# Patient Record
Sex: Female | Born: 1975
Health system: Southern US, Community
[De-identification: ages and names within clinical notes are randomized; demographics above are authoritative.]

## PROBLEM LIST (undated history)

## (undated) DIAGNOSIS — K259 Gastric ulcer, unspecified as acute or chronic, without hemorrhage or perforation: Secondary | ICD-10-CM

## (undated) DIAGNOSIS — J40 Bronchitis, not specified as acute or chronic: Secondary | ICD-10-CM

## (undated) DIAGNOSIS — B009 Herpesviral infection, unspecified: Secondary | ICD-10-CM

## (undated) DIAGNOSIS — K219 Gastro-esophageal reflux disease without esophagitis: Secondary | ICD-10-CM

## (undated) DIAGNOSIS — R87619 Unspecified abnormal cytological findings in specimens from cervix uteri: Secondary | ICD-10-CM

## (undated) HISTORY — PX: OTHER SURGICAL HISTORY: SHX169

## (undated) HISTORY — DX: Gastro-esophageal reflux disease without esophagitis: K21.9

## (undated) HISTORY — DX: Bronchitis, not specified as acute or chronic: J40

## (undated) HISTORY — DX: Gastric ulcer, unspecified as acute or chronic, without hemorrhage or perforation: K25.9

## (undated) HISTORY — DX: Herpesviral infection, unspecified: B00.9

## (undated) HISTORY — PX: CHOLECYSTECTOMY: SHX55

## (undated) HISTORY — PX: TUBAL LIGATION: SHX77

## (undated) HISTORY — PX: WISDOM TOOTH EXTRACTION: SHX21

## (undated) HISTORY — DX: Unspecified abnormal cytological findings in specimens from cervix uteri: R87.619

---

## 1997-09-01 ENCOUNTER — Encounter (HOSPITAL_COMMUNITY): Admission: RE | Admit: 1997-09-01 | Discharge: 1997-09-07 | Payer: Self-pay | Admitting: Obstetrics & Gynecology

## 1997-09-05 ENCOUNTER — Inpatient Hospital Stay (HOSPITAL_COMMUNITY): Admission: AD | Admit: 1997-09-05 | Discharge: 1997-09-08 | Payer: Self-pay | Admitting: Obstetrics & Gynecology

## 1997-09-09 ENCOUNTER — Encounter: Admission: RE | Admit: 1997-09-09 | Discharge: 1997-12-08 | Payer: Self-pay | Admitting: *Deleted

## 1997-09-10 ENCOUNTER — Inpatient Hospital Stay (HOSPITAL_COMMUNITY): Admission: RE | Admit: 1997-09-10 | Discharge: 1997-09-10 | Payer: Self-pay | Admitting: Obstetrics

## 1997-12-05 ENCOUNTER — Emergency Department (HOSPITAL_COMMUNITY): Admission: EM | Admit: 1997-12-05 | Discharge: 1997-12-05 | Payer: Self-pay | Admitting: Emergency Medicine

## 1998-02-16 ENCOUNTER — Inpatient Hospital Stay (HOSPITAL_COMMUNITY): Admission: AD | Admit: 1998-02-16 | Discharge: 1998-02-16 | Payer: Self-pay | Admitting: Obstetrics

## 1998-02-16 ENCOUNTER — Emergency Department (HOSPITAL_COMMUNITY): Admission: EM | Admit: 1998-02-16 | Discharge: 1998-02-16 | Payer: Self-pay | Admitting: Emergency Medicine

## 1998-06-22 ENCOUNTER — Emergency Department (HOSPITAL_COMMUNITY): Admission: EM | Admit: 1998-06-22 | Discharge: 1998-06-22 | Payer: Self-pay

## 1999-01-23 ENCOUNTER — Emergency Department (HOSPITAL_COMMUNITY): Admission: EM | Admit: 1999-01-23 | Discharge: 1999-01-23 | Payer: Self-pay | Admitting: Emergency Medicine

## 1999-04-17 ENCOUNTER — Emergency Department (HOSPITAL_COMMUNITY): Admission: EM | Admit: 1999-04-17 | Discharge: 1999-04-17 | Payer: Self-pay | Admitting: Emergency Medicine

## 1999-07-01 ENCOUNTER — Emergency Department (HOSPITAL_COMMUNITY): Admission: EM | Admit: 1999-07-01 | Discharge: 1999-07-02 | Payer: Self-pay | Admitting: Emergency Medicine

## 1999-11-01 ENCOUNTER — Emergency Department (HOSPITAL_COMMUNITY): Admission: EM | Admit: 1999-11-01 | Discharge: 1999-11-01 | Payer: Self-pay | Admitting: Emergency Medicine

## 2000-08-29 ENCOUNTER — Emergency Department (HOSPITAL_COMMUNITY): Admission: EM | Admit: 2000-08-29 | Discharge: 2000-08-29 | Payer: Self-pay

## 2000-08-31 ENCOUNTER — Emergency Department (HOSPITAL_COMMUNITY): Admission: EM | Admit: 2000-08-31 | Discharge: 2000-08-31 | Payer: Self-pay | Admitting: Emergency Medicine

## 2001-01-26 ENCOUNTER — Emergency Department (HOSPITAL_COMMUNITY): Admission: EM | Admit: 2001-01-26 | Discharge: 2001-01-26 | Payer: Self-pay | Admitting: Emergency Medicine

## 2001-02-05 ENCOUNTER — Emergency Department (HOSPITAL_COMMUNITY): Admission: EM | Admit: 2001-02-05 | Discharge: 2001-02-05 | Payer: Self-pay | Admitting: *Deleted

## 2001-06-16 ENCOUNTER — Encounter: Payer: Self-pay | Admitting: Emergency Medicine

## 2001-06-16 ENCOUNTER — Emergency Department (HOSPITAL_COMMUNITY): Admission: EM | Admit: 2001-06-16 | Discharge: 2001-06-16 | Payer: Self-pay | Admitting: Emergency Medicine

## 2002-03-09 ENCOUNTER — Emergency Department (HOSPITAL_COMMUNITY): Admission: EM | Admit: 2002-03-09 | Discharge: 2002-03-09 | Payer: Self-pay

## 2002-08-24 ENCOUNTER — Inpatient Hospital Stay (HOSPITAL_COMMUNITY): Admission: AD | Admit: 2002-08-24 | Discharge: 2002-08-24 | Payer: Self-pay | Admitting: *Deleted

## 2003-02-10 ENCOUNTER — Emergency Department (HOSPITAL_COMMUNITY): Admission: EM | Admit: 2003-02-10 | Discharge: 2003-02-10 | Payer: Self-pay | Admitting: Emergency Medicine

## 2003-03-31 ENCOUNTER — Encounter: Payer: Self-pay | Admitting: Obstetrics and Gynecology

## 2003-03-31 ENCOUNTER — Inpatient Hospital Stay (HOSPITAL_COMMUNITY): Admission: AD | Admit: 2003-03-31 | Discharge: 2003-03-31 | Payer: Self-pay | Admitting: Obstetrics and Gynecology

## 2003-04-01 ENCOUNTER — Inpatient Hospital Stay (HOSPITAL_COMMUNITY): Admission: AD | Admit: 2003-04-01 | Discharge: 2003-04-04 | Payer: Self-pay | Admitting: Obstetrics and Gynecology

## 2003-04-08 ENCOUNTER — Encounter: Payer: Self-pay | Admitting: Emergency Medicine

## 2003-04-08 ENCOUNTER — Inpatient Hospital Stay (HOSPITAL_COMMUNITY): Admission: EM | Admit: 2003-04-08 | Discharge: 2003-04-11 | Payer: Self-pay | Admitting: Emergency Medicine

## 2003-04-09 ENCOUNTER — Encounter: Payer: Self-pay | Admitting: Urology

## 2003-04-09 ENCOUNTER — Encounter: Payer: Self-pay | Admitting: Cardiology

## 2003-05-06 ENCOUNTER — Ambulatory Visit: Admission: RE | Admit: 2003-05-06 | Discharge: 2003-05-06 | Payer: Self-pay | Admitting: Obstetrics and Gynecology

## 2003-05-29 ENCOUNTER — Other Ambulatory Visit: Admission: RE | Admit: 2003-05-29 | Discharge: 2003-05-29 | Payer: Self-pay | Admitting: Obstetrics and Gynecology

## 2004-06-01 ENCOUNTER — Emergency Department (HOSPITAL_COMMUNITY): Admission: EM | Admit: 2004-06-01 | Discharge: 2004-06-01 | Payer: Self-pay | Admitting: Emergency Medicine

## 2006-03-07 ENCOUNTER — Emergency Department (HOSPITAL_COMMUNITY): Admission: EM | Admit: 2006-03-07 | Discharge: 2006-03-07 | Payer: Self-pay | Admitting: Emergency Medicine

## 2006-08-09 ENCOUNTER — Emergency Department (HOSPITAL_COMMUNITY): Admission: EM | Admit: 2006-08-09 | Discharge: 2006-08-09 | Payer: Self-pay | Admitting: Emergency Medicine

## 2008-06-20 DIAGNOSIS — R87619 Unspecified abnormal cytological findings in specimens from cervix uteri: Secondary | ICD-10-CM

## 2008-06-20 HISTORY — DX: Unspecified abnormal cytological findings in specimens from cervix uteri: R87.619

## 2008-09-24 ENCOUNTER — Emergency Department (HOSPITAL_COMMUNITY): Admission: EM | Admit: 2008-09-24 | Discharge: 2008-09-24 | Payer: Self-pay | Admitting: Emergency Medicine

## 2009-03-10 ENCOUNTER — Ambulatory Visit: Payer: Self-pay | Admitting: Cardiology

## 2009-03-10 ENCOUNTER — Emergency Department (HOSPITAL_COMMUNITY): Admission: EM | Admit: 2009-03-10 | Discharge: 2009-03-10 | Payer: Self-pay | Admitting: Emergency Medicine

## 2009-03-13 ENCOUNTER — Encounter: Payer: Self-pay | Admitting: Cardiology

## 2009-03-13 ENCOUNTER — Ambulatory Visit: Payer: Self-pay

## 2009-03-20 DIAGNOSIS — I498 Other specified cardiac arrhythmias: Secondary | ICD-10-CM | POA: Insufficient documentation

## 2009-03-20 DIAGNOSIS — J40 Bronchitis, not specified as acute or chronic: Secondary | ICD-10-CM | POA: Insufficient documentation

## 2009-03-20 DIAGNOSIS — R0602 Shortness of breath: Secondary | ICD-10-CM | POA: Insufficient documentation

## 2009-03-25 ENCOUNTER — Ambulatory Visit: Payer: Self-pay | Admitting: Cardiology

## 2009-03-25 ENCOUNTER — Encounter: Payer: Self-pay | Admitting: Cardiology

## 2009-08-30 ENCOUNTER — Emergency Department (HOSPITAL_COMMUNITY): Admission: EM | Admit: 2009-08-30 | Discharge: 2009-08-30 | Payer: Self-pay | Admitting: Emergency Medicine

## 2009-12-16 ENCOUNTER — Ambulatory Visit (HOSPITAL_COMMUNITY): Admission: RE | Admit: 2009-12-16 | Discharge: 2009-12-16 | Payer: Self-pay

## 2010-02-23 ENCOUNTER — Encounter (INDEPENDENT_AMBULATORY_CARE_PROVIDER_SITE_OTHER): Payer: Self-pay | Admitting: Surgery

## 2010-02-23 ENCOUNTER — Ambulatory Visit (HOSPITAL_COMMUNITY): Admission: RE | Admit: 2010-02-23 | Discharge: 2010-02-24 | Payer: Self-pay | Admitting: Surgery

## 2010-09-02 LAB — COMPREHENSIVE METABOLIC PANEL
ALT: 10 U/L (ref 0–35)
AST: 15 U/L (ref 0–37)
Alkaline Phosphatase: 30 U/L — ABNORMAL LOW (ref 39–117)
CO2: 29 mEq/L (ref 19–32)
Chloride: 105 mEq/L (ref 96–112)
GFR calc Af Amer: >60 mL/min (ref >60)
GFR calc non Af Amer: >60 mL/min (ref >60)
Glucose, Bld: 68 mg/dL — ABNORMAL LOW (ref 70–99)
Sodium: 141 mEq/L (ref 135–145)
Total Bilirubin: 0.5 mg/dL (ref 0.3–1.2)

## 2010-09-02 LAB — DIFFERENTIAL
Basophils Absolute: 0 10*3/uL (ref 0.0–0.1)
Basophils Relative: 0 % (ref 0–1)
Eosinophils Absolute: 0.1 10*3/uL (ref 0.0–0.7)
Eosinophils Relative: 2 % (ref 0–5)
Neutrophils Relative %: 60 % (ref 43–77)

## 2010-09-02 LAB — URINALYSIS, ROUTINE W REFLEX MICROSCOPIC
Glucose, UA: NEGATIVE mg/dL
Specific Gravity, Urine: 1.01 (ref 1.005–1.030)
Urobilinogen, UA: 0.2 mg/dL (ref 0.0–1.0)

## 2010-09-02 LAB — PROTIME-INR
INR: 1.07 (ref 0.00–1.49)
Prothrombin Time: 14.1 seconds (ref 11.6–15.2)

## 2010-09-02 LAB — CBC
HCT: 38.1 % (ref 36.0–46.0)
Hemoglobin: 13.1 g/dL (ref 12.0–15.0)
MCHC: 34.5 g/dL (ref 30.0–36.0)
RBC: 4.26 MIL/uL (ref 3.87–5.11)

## 2010-09-02 LAB — URINE MICROSCOPIC-ADD ON

## 2010-09-24 LAB — URINALYSIS, ROUTINE W REFLEX MICROSCOPIC
Bilirubin Urine: NEGATIVE
Glucose, UA: NEGATIVE mg/dL
Ketones, ur: NEGATIVE mg/dL
Nitrite: NEGATIVE
Protein, ur: NEGATIVE mg/dL
Specific Gravity, Urine: 1.01 (ref 1.005–1.030)
Urobilinogen, UA: 0.2 mg/dL (ref 0.0–1.0)
pH: 7 (ref 5.0–8.0)

## 2010-09-24 LAB — CBC
MCHC: 33.8 g/dL (ref 30.0–36.0)
MCV: 87.9 fL (ref 78.0–100.0)
RBC: 3.91 MIL/uL (ref 3.87–5.11)
RDW: 15.2 % (ref 11.5–15.5)

## 2010-09-24 LAB — URINE MICROSCOPIC-ADD ON

## 2010-09-24 LAB — BASIC METABOLIC PANEL
BUN: 10 mg/dL (ref 6–23)
CO2: 24 mEq/L (ref 19–32)
Chloride: 109 mEq/L (ref 96–112)
Creatinine, Ser: 0.55 mg/dL (ref 0.4–1.2)
GFR calc Af Amer: >60 mL/min (ref >60)
Glucose, Bld: 81 mg/dL (ref 70–99)

## 2010-09-24 LAB — TROPONIN I

## 2010-09-24 LAB — CK TOTAL AND CKMB (NOT AT ARMC)
CK, MB: 1 ng/mL (ref 0.3–4.0)
Relative Index: INVALID (ref 0.0–2.5)
Total CK: 25 U/L (ref 7–177)

## 2010-09-24 LAB — BRAIN NATRIURETIC PEPTIDE: Pro B Natriuretic peptide (BNP): 467 pg/mL — ABNORMAL HIGH (ref 0.0–100.0)

## 2010-11-05 NOTE — Discharge Summary (Signed)
NAME:  April Nielsen, April Nielsen                          ACCOUNT NO.:  [id number]   MEDICAL RECORD NO.:  [id number]                   PATIENT TYPE:  INP   LOCATION:  0381                                 FACILITY:  [facility]   PHYSICIAN:  Melissa L. Ladona Ridgel, MD               DATE OF BIRTH:  25-Feb-1976   DATE OF ADMISSION:  04/08/2003  DATE OF DISCHARGE:  04/11/2003                                 DISCHARGE SUMMARY   ADMITTING DIAGNOSIS:  Chest tightness.   DISCHARGE DIAGNOSIS:  Pneumonia.   HISTORY OF PRESENT ILLNESS:  The patient is a 35 year old white female who  presented to the emergency room with increasing chest tightness and dyspnea  on exertion.  She also complained of ringing in her ears since the night  prior to admission.  Of significance, the patient is seven days postpartum  after a cesarean section.  She reports that she cannot take deep breaths,  and if she does, she feels as if her lungs are going to explode.  She denies  any cough or sputum production.  In the emergency room, she was found to  have a heart rate of 40, for which she underwent a CT of the chest to rule  out pulmonary embolus.  The CT scan revealed no pulmonary embolus, and she  was admitted to the hospital for further work-up.  As per her H&P, her past  medical history is significant only for genital herpes, and her cesarean  section which occurred seven days prior to admission.  The only medication  she was taking at home was ibuprofen and Percocet.  The patient denies  tobacco use, and states that she had had no fever, chills, nausea, or  vomiting, but has had decreased appetite since discharge from the hospital.  She relates that her incisional pain was improving.   HOSPITAL COURSE:  The patient was admitted to the telemetry floor, placed on  telemetry, and observed.  During the first night of admission, the patient  developed a cough with sputum production, and was placed on IV antibiotics.  Initially the  order was for azithromycin and ceftriaxone.  Because the  patient was breast feeding, investigation into the appropriate medication  was undertaken.  It was deemed safe to provide the patient with  azithromycin, and a cephalosporin would also be a preferred drug of choice  in a lactating patient.  Unfortunately, the patient expressed a penicillin  allergy, which was deemed to be a rash.  After discussing the possibility to  an allergic reaction to cephalosporins and the fact that there is some cross-  sensitivity to taking cephalosporins with a penicillin allergy, the patient  agreed to a trial dose with close observation.  She was started on cefepime  and azithromycin to treat a presumed hospital-acquired pneumonia.  Over the  course of the day, the patient did improve.  By the following day, her only  complaint was of a strong headache.  This persisted throughout the day, and  was relieved somewhat by Tylenol.  She did eventually state that the head  was resolved, and so no further investigation into her interventions were  deemed necessary.  A 2-D echo was performed during the course of the  hospitalization, which revealed an ejection fraction of 55-65% with a normal  left ventricular diastolic and systolic function.  Her left ventricle was  upper limit of normal.  There was trivial aortic valve regurgitation and  mild increase in her peak pulmonary pressures.  On the day of discharge, the  patient's physical examination was as follows.   DISCHARGE PHYSICAL EXAMINATION:  VITAL SIGNS:  Temperature was 98.2, blood  pressure 128/56.  Her pulse had ranged from 49-60 over the course of the  evening, and she was asymptomatic during low heart rates.  Her respiratory  rate was 20, and her saturation was 90% on room air.  GENERAL:  No acute  distress.  HEENT:  Pupils equal, round and reactive to light.  Her mucous  membranes were moist.  There were no oral lesions.  NECK:  Supple.  There  was no  JVD.  CHEST:  Clear to auscultation with no rales, rhonchi, or  wheezes.  CARDIOVASCULAR:  Regular rate and rhythm.  Positive S1 and S2.  No  S3 or S4.  ABDOMEN:  Soft, nontender, nondistended, with positive bowel  sounds.  Her incision was clean, dry, and intact.  EXTREMITIES:  No edema,  and she had 2+ pulses in her radial, femoral, and DP areas.   ASSESSMENT AND PLAN:  This is a 35 year old postpartum female who had  developed a pneumonia shortly after discharge from the hospital after having  her cesarean section.  She was started on cefepime and azithromycin with  good results.  She did not have a cross reaction with the cefepime in the  face of having a penicillin allergy.   DISCHARGE MEDICATIONS:  1. Azithromycin 250 mg p.o. x7 days, once daily.  2. Tylenol p.r.n. for pain.   FOLLOW UP:  The patient was instructed to follow up with Dr. Su Hilt for her  usual postpartum visit.  She was also instructed to establish a primary care  physician who could follow her cardiac status as an outpatient.  She was  told that if she developed fever, chills, nausea, vomiting, or worsening  cough, that she should return to her primary care physician's office or her  GYN physician's office.  She expressed understanding at being discharged on  a single agent, azithromycin 250 mg p.o., and felt that she would be able to  determine if she was worsening in her symptoms.                                               Melissa L. Ladona Ridgel, MD    MLT/MEDQ  D:  04/15/2003  T:  04/15/2003  Job:  [id number]

## 2010-11-05 NOTE — H&P (Signed)
NAME:  April Nielsen, April Nielsen NO.:  [id number]   MEDICAL RECORD NO.:  [id number]                   PATIENT TYPE:  INP   LOCATION:  9199                                 FACILITY:  [facility]   PHYSICIAN:  Osborn Coho, M.D.                DATE OF BIRTH:  1976-05-05   DATE OF ADMISSION:  04/01/2003  DATE OF DISCHARGE:                                HISTORY & PHYSICAL   CHIEF COMPLAINT:  Desires elective repeat C-section.   HISTORY:  April Nielsen is a 35 year old gravida 2, para 1-0-0-1, with a last  menstrual period in early January 2004 that was uncertain and an estimated  date of delivery of April 07, 2003, at 39 weeks 1 day, dated by a 16 week  2 day ultrasound.  The patient desires an elective repeat C-section.  Her  prenatal care has been at [facility] since approximately 14  weeks of pregnancy.  Her intake blood pressure was 110/64 with a high of  130/70 during her prenatal care.  Her issues during this pregnancy:   1. History of HSV.  2. PENICILLIN allergy.  3. Previous C-section.  4. Uncertain LMP.  5. Marginal previa, resolved.   PRENATAL LABORATORY DATA:  A positive, antibody negative.  Hemoglobin 11.2,  hematocrit 33.4, platelets 234.  RPR nonreactive.  Rubella immune.  Hepatitis B surface antigen negative.  HIV nonreactive.  Cystic fibrosis  screen negative.  Gonorrhea negative, Chlamydia negative, both in the first  trimester.  Third trimester gonorrhea negative and third trimester Chlamydia  negative.  Quad screen was within normal limits.  Group B strep was  negative.  Glucose challenge test was normal with a value of 131.  Ultrasound on Oct 23, 2002:  Single live intrauterine pregnancy with normal  amniotic fluid, with an estimated date of delivery April 07, 2003, and she  was at 16 weeks 2 days, cervical length was 5.5 cm, with a marginal anterior  placenta.  Ultrasound on Nov 13, 2002:  Normal anatomy, cervix 4.3 cm,  anterior  placenta.   PAST OBSTETRICAL/GYNECOLOGIC HISTORY:  C-section x1 in March 1999 of a 7  pound 6 ounce female infant.  C-section was performed secondary to occult  cord.   Past GYN history:  History of irregular menses with menarche at age 35 and  cycles lasting five days, history of abnormal Pap smear years ago with  normal Pap smears since.  Positive history of HSV with infrequent outbreaks,  patient denies a history of gonorrhea or Chlamydia.  She does report a  history of Trichomonas in 1999, which was treated.  She reports a history of  being positive for HPV and has had infrequent yeast infections.   PAST MEDICAL HISTORY:  Mild postpartum depression after last delivery  requiring no medicines.  Questionable history of seizures as a baby, on no  medicines.  Heart murmur.   PAST  SURGICAL HISTORY:  C-section, wisdom teeth out five to six years ago.   MEDICATIONS:  Prenatal vitamins and Tylenol p.r.n.   ALLERGIES:  PENICILLIN (rash).   SOCIAL HISTORY:  She denies a history of cigarette use, alcohol use, or drug  abuse.   FAMILY HISTORY:  Myocardial infarction in paternal grandmother, hypertension  in father, varicose veins in aunt, diabetes in paternal grandmother,  pituitary gland not working in a brother, ovarian cancer in maternal  grandmother, leukemia in paternal grandfather, leukemia in a cousin, stroke  in paternal grandmother, epilepsy in a brother.   REVIEW OF SYSTEMS:  Noncontributory.   PHYSICAL EXAMINATION:  VITAL SIGNS:  5 feet 4-1/2 inches, weight 213 pounds.  Blood pressure 120/70.  CHEST:  Lungs clear to auscultation bilaterally.  CARDIAC:  Rate and rhythm were regular.  ABDOMEN:  Gravid with a fundal height of 38 cm.  EXTREMITIES:  Within normal limits.  MONITORING:  Fetal heart rate 150.  PELVIC:  Vaginal exam deferred.  Pelvic exam at initial prenatal visit:  External genitalia within normal limits.  Vagina within normal limits.  No  palpable masses,  nontender.  BREASTS:  Breast exam in office at initial prenatal visit without masses or  discharge.  No palpable nodes.   ASSESSMENT AND PLAN:  April Nielsen is a 35 year old gravida 2, para 1, at 39  weeks 1 day, for elective repeat C-section.  Preop labs to be done.  Consent  to be signed and witnessed.                                               Osborn Coho, M.D.    AR/MEDQ  D:  04/01/2003  T:  04/01/2003  Job:  [id number]

## 2010-11-05 NOTE — Discharge Summary (Signed)
   NAME:  April Nielsen, April Nielsen                          ACCOUNT NO.:  [id number]   MEDICAL RECORD NO.:  [id number]                   PATIENT TYPE:  INP   LOCATION:  0381                                 FACILITY:  [facility]   PHYSICIAN:  Melissa L. Ladona Ridgel, MD               DATE OF BIRTH:  01-27-1976   DATE OF ADMISSION:  04/08/2003  DATE OF DISCHARGE:  04/11/2003                                 DISCHARGE SUMMARY   ADDENDUM:  The patient's condition was stable at discharge.                                               Melissa L. Ladona Ridgel, MD    MLT/MEDQ  D:  04/15/2003  T:  04/15/2003  Job:  [id number]

## 2010-11-05 NOTE — H&P (Signed)
NAME:  April Nielsen, April Nielsen                          ACCOUNT NO.:  [id number]   MEDICAL RECORD NO.:  [id number]                   PATIENT TYPE:  EMS   LOCATION:  ED                                   FACILITY:  [facility]   PHYSICIAN:  Corinna L. Lendell Caprice, MD             DATE OF BIRTH:  March 25, 1976   DATE OF ADMISSION:  04/08/2003  DATE OF DISCHARGE:                                HISTORY & PHYSICAL   CHIEF COMPLAINT:  Chest tightness.   HISTORY OF PRESENT ILLNESS:  April Nielsen is a 35 year old white female who is  seven days postpartum after a cesarean section who presents to the emergency  room with chest tightness, dyspnea, and ringing in her ears since last  night.  She reports that she cannot take a deep breath and it does not  worsen by movement.  She denies a cough.  Heart rate in the emergency room  was found to be 40.  Dr. Riley Kill of cardiology was called by Dr. Estell Harpin, the  ER physician.  He recommended ruling out pulmonary embolus and admitting to  internal medicine.  CT of the chest was reportedly negative per ER  physician.  The patient has no history of pre-risk, bradycardia, or heart  problems.   PAST MEDICAL HISTORY:  Recurrent genital herpes.  Her cesarean section seven  days ago was unremarkable.   MEDICATIONS:  1. Ibuprofen.  2. Percocet.   SOCIAL HISTORY:  The patient does not drink or smoke.   FAMILY HISTORY:  Significant for hypertension in the father.   REVIEW OF SYSTEMS:  CONSTITUTIONAL:  She has not been eating well recently,  but no vomiting.  HEENT:  No headache, no dysphagia.  RESPIRATORY:  No  cough, otherwise as above.  CARDIOVASCULAR:  No palpitations.  GASTROINTESTINAL:  No nausea, vomiting, or diarrhea.  GENITOURINARY:  No  dysuria or hematuria.  She is still having some vaginal bleeding.  MUSCULOSKELETAL:  Her incisional pain is improving.  PSYCHIATRIC:  She has  suffered from postpartum depression in the past, and is somewhat emotionally  labile.   NEUROLOGIC:  No seizures.  ENDOCRINE:  No diabetes.   PHYSICAL EXAMINATION:  VITAL SIGNS:  Her heart rate ranges in the 40s to  60s, blood pressure is 140/85, respiratory rate 22, temperature 97.2.  GENERAL:  The patient is well-developed, well-nourished, in no acute  distress.  HEENT:  Normocephalic, atraumatic.  Pupils equal, round, reactive to light.  Sclerae nonicteric.  NECK:  Supple, no thyromegaly.  LUNGS:  Clear to auscultation bilaterally without wheezes, rhonchi, or  rales.  CARDIOVASCULAR:  Slow rate, regular rhythm, without murmurs, rubs, or  gallops.  ABDOMEN:  Soft.  She has some mild incisional tenderness.  The incision  looks clean and no drainage.  GENITOURINARY:  Deferred.  RECTAL:  Deferred.  EXTREMITIES:  She has bilateral 1+ pitting edema, pulses are intact.  SKIN:  No rash.  PSYCHIATRIC:  Normal affect.  NEUROLOGIC:  Alert and oriented.  Cranial nerves and sensory motor exam are  intact.   LABORATORY DATA:  Her CBC is normal.  Her hemoglobin is 10.9, hematocrit is  31.5, white blood cell count is 8.6, platelet count 296.  Complete metabolic  panel is normal except for an albumin of 2.9.  CPK, MB, and troponin are all  normal.  EKG shows sinus bradycardia with PAC.  Chest x-ray shows no  infiltrate.  Wet reading of the CAT scan called to Dr. Estell Harpin reportedly  shows no pulmonary embolus.   ASSESSMENT AND PLAN:  1. Bradycardia of questionable cause.  The patient will be admitted to     telemetry.  I will hold her Percocet.  I will check a TSH and an     echocardiogram.  2. Tinnitus, possibly related to ibuprofen.  I will hold this and give     Tylenol instead.  3. Seven days postpartum, status post cesarean section.                                               Corinna L. Lendell Caprice, MD    CLS/MEDQ  D:  04/08/2003  T:  04/08/2003  Job:  [id number]

## 2010-11-05 NOTE — Discharge Summary (Signed)
   NAME:  April Nielsen, April Nielsen                          ACCOUNT NO.:  [id number]   MEDICAL RECORD NO.:  [id number]                   PATIENT TYPE:  INP   LOCATION:  9112                                 FACILITY:  [facility]   PHYSICIAN:  Crist Fat. Rivard, M.D.              DATE OF BIRTH:  15-Jan-1976   DATE OF ADMISSION:  04/01/2003  DATE OF DISCHARGE:                                 DISCHARGE SUMMARY   ADMISSION DIAGNOSES:  1. Intrauterine pregnancy at term.  2. Previous cesarean section.  3. Desiring repeat cesarean section.   DISCHARGE DIAGNOSES:  1. Intrauterine pregnancy at term.  2. Previous cesarean section.  3. Desiring repeat cesarean section.  4. Status post repeat low transverse cesarean section.  5. Breast and bottle feeding.   PROCEDURES THIS ADMISSION:  Repeat low transverse cesarean section for  delivery of a viable female infant who had Apgars of 8 and 9 and weighed 7  pounds 15 ounces on April 01, 2003; attended in delivery by Dr. Osborn Coho and Saverio Danker, C.N.M.   HOSPITAL COURSE:  April Nielsen is a 35 year old white female gravida 2 para 1-  0-0-1 who presents for elective repeat cesarean section at term and  underwent the same for delivery of a viable female infant who had Apgars of  8 and 9 and weighed 7 pounds 15 ounces on April 01, 2003 attended in  delivery by Dr. Blossom Hoops and [facility], C.N.M.  Please see  operative note for details.  Postoperatively the patient has done well.  She  is ambulating, voiding, and eating without difficulty.  Her vital signs are  stable and she is afebrile.  She is breast and bottle feeding and doing  well.  Her infant currently is under bilirubin therapy for jaundice but is  also doing well.  The patient is deemed ready for discharge today.   DISCHARGE INSTRUCTIONS:  As per the [facility] handout.   DISCHARGE MEDICATIONS:  1. Motrin 600 mg p.o. q.6h. p.r.n. for pain.  2. Tylox one to two p.o.  q.4-6h. p.r.n. for pain.  3. Micronor one p.o. daily.  4. Prenatal vitamins one p.o. daily.   DISCHARGE LABORATORY DATA:  Her hemoglobin is 10.0, her wbc count is 10.9,  her platelets are 188.   FOLLOW-UP:  Her discharge follow-up will be in four to six weeks at [facility] or p.r.n.    Concha Pyo. Duplantis, C.N.M.              Crist Fat Rivard, M.D.   SJD/MEDQ  D:  04/04/2003  T:  04/04/2003  Job:  [id number]

## 2010-11-05 NOTE — Op Note (Signed)
NAME:  April Nielsen, April Nielsen                          ACCOUNT NO.:  [id number]   MEDICAL RECORD NO.:  [id number]                   PATIENT TYPE:  INP   LOCATION:  9112                                 FACILITY:  [facility]   PHYSICIAN:  Osborn Coho, M.D.                DATE OF BIRTH:  1976-03-03   DATE OF PROCEDURE:  04/01/2003  DATE OF DISCHARGE:                                 OPERATIVE REPORT   PREOPERATIVE DIAGNOSES:  1. Term intrauterine pregnancy.  2. Elective repeat cesarean section.   POSTOPERATIVE DIAGNOSES:  1. Term intrauterine pregnancy.  2. Elective repeat cesarean section.   PROCEDURE:  Repeat low transverse cesarean section via Pfannenstiel skin  incision.   ANESTHESIA:  Spinal.   ATTENDING:  Osborn Coho, M.D.   ASSISTANT:  Concha Pyo. Duplantis, C.N.M.   FLUIDS REPLACED:  3000 mL.   ESTIMATED BLOOD LOSS:  1000 mL.   URINE OUTPUT:  325 mL.   COMPLICATIONS:  None.   FINDINGS:  Live female infant with Apgars of 8 at one minute and 9 at five  minutes.  Weight 7 pounds 15 ounces.   DESCRIPTION OF PROCEDURE:  The patient was taken to the operating room after  the risks, benefits, and alternatives were discussed with the patient.  The  patient verbalized understanding and consent signed and witnessed.  The  patient was given a spinal anesthesia and prepped and draped in a normal  sterile fashion.  A Pfannenstiel skin incision was made at the site of the  prior scar.  The incision was carried down to the underlying layer of fascia  with the Bovie, which was then used to excise the fascia in the midline  bilaterally.  The fascial incision was extended bilaterally with the Mayo  scissors.  Kocher clamps were placed on the superior aspect of the fascial  incision and the rectus muscle excised from the fascia.  The same was done  on the inferior aspect of the fascial incision.  The muscle was separated in  the midline with a hemostat and the peritoneum entered without  difficulty.  The bladder blade was placed and the bladder flap created with the  Metzenbaum scissors.  The uterine incision was made with the scalpel and  extended bilaterally with the bandage scissors.  The infant was delivered  without difficulty.  Upon delivery of the head the oropharynx and the  nasopharynx were bulb-suctioned.  The fluid was noted to be meconium-  stained.  The cord was clamped and cut and the infant was handed to the  waiting pediatricians.  Cord bloods were sent.  The placenta was removed via  fundal massage.  The uterus was cleared of all clots and debris.  The cervix  was minimally dilated with a ring forceps.  The uterine incision was  repaired with 0 Vicryl in a running locked fashion.  A second imbricating  layer was performed.  Bilateral ovaries were visualized and noted to be  within normal limits.  The abdomen was copiously irrigated.  The uterine  incision was noted to be hemostatic.  The peritoneum was closed with 3-0  chromic in a running fashion.  The fascia was repaired with 0 Vicryl in a  running fashion.  Plain 0 was used to reapproximate the subcutaneous tissue.  The skin was closed with staples.  Sponge, lap, and needle count was  correct.  The patient tolerated the procedure well and was returned to the  recovery room in stable condition.                                               Osborn Coho, M.D.    AR/MEDQ  D:  04/01/2003  T:  04/01/2003  Job:  [id number]

## 2011-02-02 ENCOUNTER — Encounter: Payer: Self-pay | Admitting: Cardiology

## 2012-03-02 ENCOUNTER — Encounter: Payer: Self-pay | Admitting: Sports Medicine

## 2012-03-02 ENCOUNTER — Ambulatory Visit (INDEPENDENT_AMBULATORY_CARE_PROVIDER_SITE_OTHER): Payer: 59 | Admitting: Sports Medicine

## 2012-03-02 VITALS — BP 103/70 | HR 74 | Temp 97.9°F | Ht 64.25 in | Wt 156.0 lb

## 2012-03-02 DIAGNOSIS — M542 Cervicalgia: Secondary | ICD-10-CM

## 2012-03-02 MED ORDER — TRAMADOL HCL 50 MG PO TABS: 50.0000 mg | ORAL_TABLET | Freq: Every day | ORAL | Status: DC

## 2012-03-02 NOTE — Assessment & Plan Note (Signed)
Her symptoms are well-controlled with Motrin and tramadol. I will refill these. I would like any MRI of her cervical spine. She will also do rehabilitation. I will see her back to go over the MRI results.

## 2012-03-02 NOTE — Progress Notes (Signed)
Patient ID: April Nielsen, female   DOB: [birth year], 36 y.o.   MRN: [id number] Subjective:    CC: Establish care. 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 She comes in with a long history of pain, burning, and numbness that comes down her right arm in what sounds to be a C7 type distribution. Occasionally it does come down her left side as well. She recalls going on a course of prednisone, and having the symptoms resolve for approximately a week. She did have some EMG, and nerve conductions done, and was then diagnosed with thoracic outlet syndrome. She has really never had any workup for cervical radiculopathy.  Her pain is well controlled when it occurs predominantly at bedtime. She takes a tramadol, and some Motrin at bedtime.  Past medical history, Surgical history, Family history, Social history, Allergies, and medications have been entered into the medical record, reviewed, and no changes needed.   Review of Systems: No headache, visual changes, nausea, vomiting, diarrhea, constipation, dizziness, abdominal pain, skin rash, fevers, chills, night sweats, weight loss, chest pain, body aches, joint swelling, muscle aches, or shortness of breath.   Objective:    General: Well Developed, well nourished, and in no acute distress.  Neuro: Alert and oriented x3, extra-ocular muscles intact.  HEENT: Normocephalic, atraumatic, pupils equal round reactive to light, neck supple, no masses, no lymphadenopathy, thyroid nonpalpable.  Skin: Warm and dry, no rashes noted.  Cardiac: Regular rate and rhythm, no murmurs rubs or gallops.  Respiratory: Clear to auscultation bilaterally. Not using accessory muscles, speaking in full sentences.  Abdominal: Soft, nontender, nondistended, positive bowel sounds, no masses, no organomegaly.  Musculoskeletal: Shoulder, elbow, wrist, hip, knee, ankle stable, and with full range of motion. Neck: Inspection unremarkable. No  palpable stepoffs. Negative Spurling's maneuver. Full neck range of motion, there is pain with extension it is localized in the right posterior neck. Grip strength and sensation normal in bilateral hands Strength good C4 to T1 distribution No sensory change to C4 to T1 Negative Hoffman sign bilaterally Reflexes mildly decreased to right triceps, otherwise 2+.  Impression and Recommendations:

## 2012-03-06 ENCOUNTER — Other Ambulatory Visit (HOSPITAL_BASED_OUTPATIENT_CLINIC_OR_DEPARTMENT_OTHER): Payer: 59

## 2012-03-13 ENCOUNTER — Ambulatory Visit (HOSPITAL_BASED_OUTPATIENT_CLINIC_OR_DEPARTMENT_OTHER)
Admission: RE | Admit: 2012-03-13 | Discharge: 2012-03-13 | Disposition: A | Discharge status: Home / Self Care | Payer: 59 | Source: Ambulatory Visit | Attending: Sports Medicine | Admitting: Sports Medicine

## 2012-03-13 ENCOUNTER — Telehealth: Payer: Self-pay | Admitting: Sports Medicine

## 2012-03-13 DIAGNOSIS — M5020 Other cervical disc displacement, unspecified cervical region: Secondary | ICD-10-CM | POA: Insufficient documentation

## 2012-03-13 DIAGNOSIS — M4802 Spinal stenosis, cervical region: Secondary | ICD-10-CM | POA: Insufficient documentation

## 2012-03-13 DIAGNOSIS — M542 Cervicalgia: Secondary | ICD-10-CM

## 2012-03-13 NOTE — Telephone Encounter (Signed)
Please shoot April Nielsen  acall and let her know she doesn't seem to have thoracic outlet syndrome, she has a disc protrusion worst at the C5/6 level on the right side, likely touching the nerve root and causing her symptoms.  She can see me to go over images and come up with tx plan if she likes.

## 2012-03-14 NOTE — Telephone Encounter (Signed)
Pt has been scheduled to come in Oct 1st at 10am

## 2012-03-20 ENCOUNTER — Ambulatory Visit (INDEPENDENT_AMBULATORY_CARE_PROVIDER_SITE_OTHER): Payer: 59 | Admitting: Sports Medicine

## 2012-03-20 ENCOUNTER — Encounter: Payer: Self-pay | Admitting: Sports Medicine

## 2012-03-20 VITALS — BP 131/91 | HR 97 | Ht 64.25 in | Wt 158.0 lb

## 2012-03-20 DIAGNOSIS — M542 Cervicalgia: Secondary | ICD-10-CM

## 2012-03-20 NOTE — Assessment & Plan Note (Addendum)
We had a long discussion regarding the anatomy, as well as further treatment options. We went over MRI images. Her symptoms are well controlled with tramadol, so we will stay the course for now. Should her symptoms recur, or worsen, she would be a candidate for a transforaminal or even interlaminar epidural steroid injection at the C5-C6 level. She will come back to see me on an as-needed basis, look forward to seeing this extremely pleasant young lady again.

## 2012-03-20 NOTE — Progress Notes (Signed)
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 She comes back to see me for followup of pain that she's been having in her right shoulder, and neck. At her initial visit I suspected a right-sided C7 radiculopathy. Her symptoms at that point were well controlled with tramadol at night. She carried a diagnosis of thoracic outlet syndrome from her previous physician. We obtained an MRI that confirmed our suspicion of a cervical disc protrusion. In her case, it was at the C5-C6 level, affecting the exiting right C6 nerve root.  Past medical history, Surgical history, Family history, Social history, Allergies, and medications have been entered into the medical record, reviewed, and no changes needed.   Review of Systems: No fevers, chills, night sweats, weight loss, chest pain, or shortness of breath.   Objective:    General: Well Developed, well nourished, and in no acute distress.  Neuro: Alert and oriented x3, extra-ocular muscles intact.  HEENT: Normocephalic, atraumatic, pupils equal round reactive to light. Skin: Warm and dry, no rashes. Respiratory: Not using accessory muscles, speaking in full sentences.  We went over the images, and I reviewed them myself. They do show a disc protrusion at the C4-C5, as well as the C5-C6 level, worse at the C5-C6 level causing right-sided foraminal stenosis. This appears to compress the exiting right C6 nerve root.  Impression and Recommendations:

## 2012-06-05 ENCOUNTER — Telehealth (INDEPENDENT_AMBULATORY_CARE_PROVIDER_SITE_OTHER): Payer: Self-pay

## 2012-06-05 NOTE — Telephone Encounter (Addendum)
Patient calling into office to report having an RUQ pain that radiates to her back for one week.  Patient reports some nausea and vomiting, patient reports having abnormal BM's.  Patient denies having any fever.  Patient currently on a bland diet. Patient had gallbladder removed in 2011.  Patient advised to call PCP for further follow up.

## 2012-10-16 ENCOUNTER — Encounter: Payer: Self-pay | Admitting: Sports Medicine

## 2012-10-16 ENCOUNTER — Ambulatory Visit (INDEPENDENT_AMBULATORY_CARE_PROVIDER_SITE_OTHER): Payer: 59 | Admitting: Sports Medicine

## 2012-10-16 VITALS — BP 131/82 | HR 62 | Wt 158.0 lb

## 2012-10-16 DIAGNOSIS — R1013 Epigastric pain: Secondary | ICD-10-CM | POA: Insufficient documentation

## 2012-10-16 LAB — CBC
HCT: 37.4 % (ref 36.0–46.0)
Hemoglobin: 12.9 g/dL (ref 12.0–15.0)
MCH: 29.2 pg (ref 26.0–34.0)
MCHC: 34.5 g/dL (ref 30.0–36.0)
MCV: 84.6 fL (ref 78.0–100.0)
Platelets: 255 10*3/uL (ref 150–400)
RBC: 4.42 MIL/uL (ref 3.87–5.11)
RDW: 13.9 % (ref 11.5–15.5)
WBC: 10 10*3/uL (ref 4.0–10.5)

## 2012-10-16 MED ORDER — SUCRALFATE 1 G PO TABS: 1.0000 g | ORAL_TABLET | Freq: Four times a day (QID) | ORAL | Status: DC

## 2012-10-16 MED ORDER — PANTOPRAZOLE SODIUM 40 MG PO TBEC: 40.0000 mg | DELAYED_RELEASE_TABLET | Freq: Two times a day (BID) | ORAL | Status: DC

## 2012-10-16 NOTE — Assessment & Plan Note (Signed)
Symptoms have been on and off for approximately 4 months. She is unable to find an association with food, deep breathing, palpation. She is a family history of peptic ulcer disease. As the pain does go to the back as well, I am concerned about gastric or duodenal ulcer. No red flags, such as melena, hematochezia, hematemesis, constipation. CBC, CMET, lipase, H. pylori. Adding protonix and Carafate. She'll come back to see me in 4 weeks if no better I do think we need to pursue upper endoscopy

## 2012-10-16 NOTE — Progress Notes (Signed)
  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 She is status post cholecystectomy about 4 years ago. Since then she's had pain on and off that she localizes in the above location, the pain does go to her back at times. She denies any reflux symptoms, denies any melena, hematochezia, hematemesis. She is unaware as to whether the pain mimics her periods, or is worse or better with food, deep breathing, or activity. The pain is mild to moderate. She has a family history of peptic ulcer disease.    Past medical history, Surgical history, Family history not pertinant except as noted below, Social history, Allergies, and medications have been entered into the medical record, reviewed, and no changes needed.   Review of Systems: No fevers, chills, night sweats, weight loss, chest pain, or shortness of breath.   Objective:    General: Well Developed, well nourished, and in no acute distress.  Neuro: Alert and oriented x3, extra-ocular muscles intact, sensation grossly intact.  HEENT: Normocephalic, atraumatic, pupils equal round reactive to light, neck supple, no masses, no lymphadenopathy, thyroid nonpalpable.  Skin: Warm and dry, no rashes. Cardiac: Regular rate and rhythm, no murmurs rubs or gallops, no lower extremity edema.  Respiratory: Clear to auscultation bilaterally. Not using accessory muscles, speaking in full sentences. Abdomen: Soft, nontender, nondistended, normal bowel sounds, no palpable masses, no rigidity.   Impression and Recommendations:

## 2012-10-17 LAB — URINALYSIS
Bilirubin Urine: NEGATIVE
Glucose, UA: NEGATIVE mg/dL
Hgb urine dipstick: NEGATIVE
Ketones, ur: NEGATIVE mg/dL
Leukocytes, UA: NEGATIVE
Nitrite: NEGATIVE
Protein, ur: NEGATIVE mg/dL
Specific Gravity, Urine: 1.018 (ref 1.005–1.030)
Urobilinogen, UA: 0.2 mg/dL (ref 0.0–1.0)
pH: 5.5 (ref 5.0–8.0)

## 2012-10-17 LAB — COMPREHENSIVE METABOLIC PANEL
ALT: 12 U/L (ref 0–35)
AST: 12 U/L (ref 0–37)
Alkaline Phosphatase: 26 U/L — ABNORMAL LOW (ref 39–117)
Sodium: 141 mEq/L (ref 135–145)
Total Bilirubin: 0.4 mg/dL (ref 0.3–1.2)
Total Protein: 7.1 g/dL (ref 6.0–8.3)

## 2012-10-17 LAB — COMPREHENSIVE METABOLIC PANEL WITH GFR
Albumin: 4.7 g/dL (ref 3.5–5.2)
BUN: 5 mg/dL — ABNORMAL LOW (ref 6–23)
CO2: 28 meq/L (ref 19–32)
Calcium: 9.5 mg/dL (ref 8.4–10.5)
Chloride: 104 meq/L (ref 96–112)
Creat: 0.51 mg/dL (ref 0.50–1.10)
Glucose, Bld: 83 mg/dL (ref 70–99)
Potassium: 3.5 meq/L (ref 3.5–5.3)

## 2012-10-17 LAB — H. PYLORI ANTIBODY, IGG: H Pylori IgG: 0.42 {ISR}

## 2012-10-17 LAB — LIPASE: Lipase: 17 U/L (ref 0–75)

## 2012-11-13 ENCOUNTER — Encounter: Payer: Self-pay | Admitting: Sports Medicine

## 2012-11-13 ENCOUNTER — Ambulatory Visit (INDEPENDENT_AMBULATORY_CARE_PROVIDER_SITE_OTHER): Payer: 59 | Admitting: Sports Medicine

## 2012-11-13 VITALS — BP 114/78 | HR 80 | Wt 156.0 lb

## 2012-11-13 DIAGNOSIS — R1013 Epigastric pain: Secondary | ICD-10-CM

## 2012-11-13 DIAGNOSIS — Z0000 Encounter for general adult medical examination without abnormal findings: Secondary | ICD-10-CM | POA: Insufficient documentation

## 2012-11-13 NOTE — Assessment & Plan Note (Signed)
As all protonic some Carafate. H. pylori was negative. Continue protonix, return as needed for this.

## 2012-11-13 NOTE — Progress Notes (Signed)
  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 I started her on pantoprazole as well as Carafate. She reports symptoms are 100% resolved. Labs were normal, and she had a negative H. pylori. Pain was epigastric, radiating to back and right upper quadrant, and she was status post cholecystectomy.  She brought cookies baked by her daughter today.  Past medical history, Surgical history, Family history not pertinant except as noted below, Social history, Allergies, and medications have been entered into the medical record, reviewed, and no changes needed.   Review of Systems: No fevers, chills, night sweats, weight loss, chest pain, or shortness of breath.   Objective:    General: Well Developed, well nourished, and in no acute distress.  Neuro: Alert and oriented x3, extra-ocular muscles intact, sensation grossly intact.  HEENT: Normocephalic, atraumatic, pupils equal round reactive to light, neck supple, no masses, no lymphadenopathy, thyroid nonpalpable.  Skin: Warm and dry, no rashes. Cardiac: Regular rate and rhythm, no murmurs rubs or gallops, no lower extremity edema.  Respiratory: Clear to auscultation bilaterally. Not using accessory muscles, speaking in full sentences. Abdomen: Soft, nontender, nondistended, normal bowel sounds, no palpable masses.  Impression and Recommendations:

## 2013-01-11 ENCOUNTER — Encounter: Payer: Self-pay | Admitting: Sports Medicine

## 2013-01-11 ENCOUNTER — Ambulatory Visit (INDEPENDENT_AMBULATORY_CARE_PROVIDER_SITE_OTHER): Payer: 59 | Admitting: Sports Medicine

## 2013-01-11 VITALS — BP 115/75 | HR 99 | Wt 154.0 lb

## 2013-01-11 DIAGNOSIS — M542 Cervicalgia: Secondary | ICD-10-CM

## 2013-01-11 MED ORDER — TRAMADOL HCL 50 MG PO TABS: 100.0000 mg | ORAL_TABLET | Freq: Three times a day (TID) | ORAL | Status: DC | PRN

## 2013-01-11 NOTE — Progress Notes (Signed)
  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 Symptoms are radiating down the right arm in the C6 distribution, moderate, persistent, stable.  Past medical history, Surgical history, Family history not pertinant except as noted below, Social history, Allergies, and medications have been entered into the medical record, reviewed, and no changes needed.   Review of Systems: No fevers, chills, night sweats, weight loss, chest pain, or shortness of breath.   Objective:    General: Well Developed, well nourished, and in no acute distress.  Neuro: Alert and oriented x3, extra-ocular muscles intact, sensation grossly intact.  HEENT: Normocephalic, atraumatic, pupils equal round reactive to light, neck supple, no masses, no lymphadenopathy, thyroid nonpalpable.  Skin: Warm and dry, no rashes. Cardiac: Regular rate and rhythm, no murmurs rubs or gallops, no lower extremity edema.  Respiratory: Clear to auscultation bilaterally. Not using accessory muscles, speaking in full sentences. Neck: Inspection unremarkable. No palpable stepoffs. Negative Spurling's maneuver. Full neck range of motion Grip strength and sensation normal in bilateral hands Strength good C4 to T1 distribution No sensory change to C4 to T1 Negative Hoffman sign bilaterally Reflexes normal  Impression and Recommendations:

## 2013-01-11 NOTE — Assessment & Plan Note (Signed)
Continues to be extremely well controlled with tramadol. Increasing to 100 mg every 8 hours as needed. 3 month supply will be given.

## 2013-03-04 ENCOUNTER — Other Ambulatory Visit: Payer: Self-pay | Admitting: *Deleted

## 2013-03-04 DIAGNOSIS — M542 Cervicalgia: Secondary | ICD-10-CM

## 2013-03-04 MED ORDER — TRAMADOL HCL 50 MG PO TABS: 100.0000 mg | ORAL_TABLET | Freq: Three times a day (TID) | ORAL | Status: DC | PRN

## 2013-03-04 MED ORDER — IBUPROFEN 200 MG PO TABS: 200.0000 mg | ORAL_TABLET | Freq: Four times a day (QID) | ORAL | Status: DC | PRN

## 2013-03-04 NOTE — Progress Notes (Signed)
By error printed Ibuprofen rx. Shredded rx for Ibuprofen and printed tramadol for Dr. Karie Schwalbe to sign.

## 2013-03-04 NOTE — Progress Notes (Signed)
Pt came in the office and states pham advised her that she will now have to pick up hard copy at her doctors office of tramadol. Pt states she only takes 1 at night for her neck pain. Reprinted rx for Dr. Karie Schwalbe to sign

## 2013-03-25 ENCOUNTER — Other Ambulatory Visit: Payer: Self-pay

## 2013-03-25 DIAGNOSIS — M542 Cervicalgia: Secondary | ICD-10-CM

## 2013-03-25 MED ORDER — TRAMADOL HCL 50 MG PO TABS: 100.0000 mg | ORAL_TABLET | Freq: Three times a day (TID) | ORAL | Status: DC | PRN

## 2013-03-25 NOTE — Telephone Encounter (Signed)
Sure, rx is in my outbox.

## 2013-03-25 NOTE — Telephone Encounter (Signed)
Ia is taking tramadol every eight hours. She needs an early refill. Is the appropriate?

## 2013-05-17 ENCOUNTER — Encounter: Payer: Self-pay | Admitting: Emergency Medicine

## 2013-05-17 ENCOUNTER — Emergency Department
Admission: EM | Admit: 2013-05-17 | Discharge: 2013-05-17 | Disposition: A | Discharge status: Home / Self Care | Payer: 59 | Source: Home / Self Care | Attending: Family Medicine | Admitting: Family Medicine

## 2013-05-17 DIAGNOSIS — J069 Acute upper respiratory infection, unspecified: Secondary | ICD-10-CM

## 2013-05-17 MED ORDER — BENZONATATE 200 MG PO CAPS: 200.0000 mg | ORAL_CAPSULE | Freq: Every day | ORAL | Status: DC

## 2013-05-17 MED ORDER — AZITHROMYCIN 250 MG PO TABS: ORAL_TABLET | ORAL | Status: DC

## 2013-05-17 NOTE — ED Provider Notes (Signed)
CSN: [id number]     Arrival date & time 05/17/13  6578 History   First MD Initiated Contact with Patient 05/17/13 [phone]     Chief Complaint  Patient presents with  . Cough    x 1 week  . Fever    x 1 week  . Nasal Congestion    x 1 week      HPI Comments: Patient developed a sore throat, myalgias, and fatigue six days ago.  This was followed by sinus congestion and nonproductive cough.  The cough has become worse, especially at night and she has tightness over her anterior chest.  She has had chills over the past two days. She has had pneumonia about three times in the past.  The history is provided by the patient.    Past Medical History  Diagnosis Date  . Bronchitis    Past Surgical History  Procedure Laterality Date  . C-sections      x3  . Wisdom tooth extraction    . Cesarean section      x 3  . Cholecystectomy    . Tubal ligation     Family History  Problem Relation Age of Onset  . Hypertension Father   . Hyperlipidemia Father   . Hyperlipidemia Mother   . Cancer Maternal Grandmother     cervical  . Cancer Maternal Grandfather     Throat  . Diabetes Paternal Grandmother   . Heart attack Paternal Grandmother   . Cancer Paternal Grandfather     Leukemia   History  Substance Use Topics  . Smoking status: Former Smoker -- 1.00 packs/day for 3 years    Types: Cigarettes  . Smokeless tobacco: Never Used     Comment: tobacco use- no   . Alcohol Use: No   OB History   Grav Para Term Preterm Abortions TAB SAB Ect Mult Living                 Review of Systems + sore throat + cough No pleuritic pain + wheezing + nasal congestion + post-nasal drainage ? sinus pain/pressure No itchy/red eyes No earache No hemoptysis + SOB No fever, + chills No nausea No vomiting No abdominal pain No diarrhea No urinary symptoms No skin rash + fatigue No myalgias + headache Used OTC meds without relief  Allergies  Review of patient's allergies indicates no  known allergies.  Home Medications   Current Outpatient Rx  Name  Route  Sig  Dispense  Refill  . acetaminophen (TYLENOL) 325 MG suppository   Rectal   Place 325 mg rectally every 4 (four) hours as needed.         . pantoprazole (PROTONIX) 40 MG tablet   Oral   Take 1 tablet (40 mg total) by mouth 2 (two) times daily.   60 tablet   3   . sucralfate (CARAFATE) 1 G tablet   Oral   Take 1 tablet (1 g total) by mouth 4 (four) times daily.   120 tablet   0   . traMADol (ULTRAM) 50 MG tablet   Oral   Take 2 tablets (100 mg total) by mouth every 8 (eight) hours as needed for pain.   180 tablet   0   . azithromycin (ZITHROMAX Z-PAK) 250 MG tablet      Take 2 tabs today; then begin one tab once daily for 4 more days.   6 each   0   . benzonatate (TESSALON) 200 MG  capsule   Oral   Take 1 capsule (200 mg total) by mouth at bedtime. Take as needed for cough   12 capsule   0   . ibuprofen (ADVIL,MOTRIN) 200 MG tablet   Oral   Take 1 tablet (200 mg total) by mouth every 6 (six) hours as needed.   30 tablet   0    BP 133/93  Pulse 105  Temp(Src) 98.3 F (36.8 C) (Oral)  Ht 5\' 4"  (1.626 m)  Wt 145 lb (65.772 kg)  BMI 24.88 kg/m2  SpO2 98% Physical Exam Nursing notes and Vital Signs reviewed. Appearance:  Patient appears healthy, stated age, and in no acute distress Eyes:  Pupils are equal, round, and reactive to light and accomodation.  Extraocular movement is intact.  Conjunctivae are not inflamed  Ears:  Canals normal.  Tympanic membranes normal.  Nose:  Mildly congested turbinates.  No sinus tenderness.   Pharynx:  Normal Neck:  Supple.  Slightly tender shotty posterior nodes are palpated bilaterally  Lungs:  Clear to auscultation.  Breath sounds are equal.  Chest:   Tenderness to palpation over the mid-sternum.  Heart:  Regular rate and rhythm without murmurs, rubs, or gallops.  Abdomen:  Nontender without masses or hepatosplenomegaly.  Bowel sounds are  present.  No CVA or flank tenderness.  Extremities:  No edema.  No calf tenderness Skin:  No rash present.   ED Course  Procedures  none          MDM   1. Acute upper respiratory infections of unspecified site    Begin Z-pack to cover atypicals.  Prescription written for Benzonatate [facility]) to take at bedtime for night-time cough.  Take plain Mucinex (guaifenesin) twice daily for cough and congestion.  May add Sudafed for sinus congestion.  Increase fluid intake, rest. May use Afrin nasal spray (or generic oxymetazoline) twice daily for about 5 days.  Also recommend using saline nasal spray several times daily and saline nasal irrigation (AYR is a common brand) Stop all antihistamines for now, and other non-prescription cough/cold preparations. May take Ibuprofen 200mg , 4 tabs every 8 hours with food for chest/sternum discomfort. Follow-up with family doctor if not improving 7 to 10 days.     Lattie Haw, MD 05/17/13 [phone]

## 2013-05-17 NOTE — ED Notes (Signed)
April Nielsen complains of dry cough, fevers, body aches, headaches, sore throat, runny nose, congestion, wheezing and chest pain for 1 week. She has taken tylenol and Robitussin with little relief.

## 2013-05-20 ENCOUNTER — Telehealth: Payer: Self-pay | Admitting: *Deleted

## 2013-05-20 DIAGNOSIS — M542 Cervicalgia: Secondary | ICD-10-CM

## 2013-05-20 NOTE — Telephone Encounter (Signed)
Pt requesting refill on Tramadol.  

## 2013-05-21 ENCOUNTER — Other Ambulatory Visit: Payer: Self-pay

## 2013-05-21 MED ORDER — TRAMADOL HCL 50 MG PO TABS: 100.0000 mg | ORAL_TABLET | Freq: Three times a day (TID) | ORAL | Status: DC | PRN

## 2013-05-21 NOTE — Telephone Encounter (Signed)
Refilled

## 2013-06-27 ENCOUNTER — Other Ambulatory Visit: Payer: Self-pay | Admitting: Sports Medicine

## 2013-07-09 ENCOUNTER — Telehealth: Payer: Self-pay

## 2013-07-09 DIAGNOSIS — M542 Cervicalgia: Secondary | ICD-10-CM

## 2013-07-09 MED ORDER — TRAMADOL HCL 50 MG PO TABS: 100.0000 mg | ORAL_TABLET | Freq: Three times a day (TID) | ORAL | Status: DC | PRN

## 2013-07-09 NOTE — Telephone Encounter (Signed)
Patient request refill for Tramadol. Rhonda Cunningham,CMA  

## 2013-07-09 NOTE — Telephone Encounter (Signed)
Prescription is inbox. 

## 2013-07-10 NOTE — Telephone Encounter (Signed)
Rx Tramadol Faxed to [facility]. Alila Sotero,CMA

## 2013-08-28 ENCOUNTER — Other Ambulatory Visit: Payer: Self-pay | Admitting: Sports Medicine

## 2013-08-29 ENCOUNTER — Other Ambulatory Visit: Payer: Self-pay

## 2013-08-29 ENCOUNTER — Other Ambulatory Visit: Payer: Self-pay | Admitting: Sports Medicine

## 2013-08-29 NOTE — Telephone Encounter (Signed)
Patient request refill for Tramadol sent to [facility]. Marguita Venning,CMA

## 2013-10-16 ENCOUNTER — Other Ambulatory Visit: Payer: Self-pay

## 2013-10-16 ENCOUNTER — Telehealth: Payer: Self-pay | Admitting: *Deleted

## 2013-10-16 MED ORDER — TRAMADOL HCL 50 MG PO TABS: ORAL_TABLET | ORAL | Status: DC

## 2013-10-16 NOTE — Telephone Encounter (Signed)
Pt calls and request a refill on Tramadol 50mg  be sent to [facility] [address] [place]. Barry DienesKimberly Gordon, LPN

## 2013-10-16 NOTE — Telephone Encounter (Signed)
Prescription is in my box 

## 2013-10-29 ENCOUNTER — Other Ambulatory Visit: Payer: Self-pay | Admitting: Sports Medicine

## 2013-12-05 ENCOUNTER — Other Ambulatory Visit: Payer: Self-pay | Admitting: *Deleted

## 2013-12-05 ENCOUNTER — Other Ambulatory Visit: Payer: Self-pay

## 2013-12-05 ENCOUNTER — Other Ambulatory Visit: Payer: Self-pay | Admitting: Sports Medicine

## 2013-12-05 MED ORDER — TRAMADOL HCL 50 MG PO TABS: ORAL_TABLET | ORAL | Status: DC

## 2014-01-23 ENCOUNTER — Other Ambulatory Visit: Payer: Self-pay

## 2014-01-23 MED ORDER — TRAMADOL HCL 50 MG PO TABS: ORAL_TABLET | ORAL | Status: DC

## 2014-03-17 ENCOUNTER — Other Ambulatory Visit: Payer: Self-pay

## 2014-03-17 MED ORDER — TRAMADOL HCL 50 MG PO TABS: ORAL_TABLET | ORAL | Status: DC

## 2014-03-17 NOTE — Telephone Encounter (Signed)
Scheduled patient for follow up.

## 2014-03-21 ENCOUNTER — Ambulatory Visit (INDEPENDENT_AMBULATORY_CARE_PROVIDER_SITE_OTHER): Payer: 59 | Admitting: Sports Medicine

## 2014-03-21 ENCOUNTER — Encounter: Payer: Self-pay | Admitting: Sports Medicine

## 2014-03-21 ENCOUNTER — Ambulatory Visit (INDEPENDENT_AMBULATORY_CARE_PROVIDER_SITE_OTHER): Payer: 59

## 2014-03-21 VITALS — BP 134/86 | HR 92 | Ht 64.0 in | Wt 145.0 lb

## 2014-03-21 DIAGNOSIS — M546 Pain in thoracic spine: Secondary | ICD-10-CM

## 2014-03-21 DIAGNOSIS — M542 Cervicalgia: Secondary | ICD-10-CM

## 2014-03-21 DIAGNOSIS — J209 Acute bronchitis, unspecified: Secondary | ICD-10-CM

## 2014-03-21 DIAGNOSIS — M609 Myositis, unspecified: Secondary | ICD-10-CM

## 2014-03-21 DIAGNOSIS — M4184 Other forms of scoliosis, thoracic region: Secondary | ICD-10-CM

## 2014-03-21 DIAGNOSIS — M791 Myalgia: Secondary | ICD-10-CM

## 2014-03-21 DIAGNOSIS — IMO0001 Reserved for inherently not codable concepts without codable children: Secondary | ICD-10-CM

## 2014-03-21 MED ORDER — CELECOXIB 200 MG PO CAPS: ORAL_CAPSULE | ORAL | Status: DC

## 2014-03-21 MED ORDER — HYDROCOD POLST-CHLORPHEN POLST 10-8 MG/5ML PO LQCR: 5.0000 mL | Freq: Two times a day (BID) | ORAL | Status: DC | PRN

## 2014-03-21 MED ORDER — RANITIDINE HCL 300 MG PO TABS: 300.0000 mg | ORAL_TABLET | Freq: Two times a day (BID) | ORAL | Status: DC

## 2014-03-21 NOTE — Progress Notes (Signed)
  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 More recently she started to have some pain and she localizes in the mid thoracic spine on the left side with palpable muscle spasm. She also has a couple of locations on her right sided upper trapezius that are painful. She does well with NSAIDs but tells me she would like to get off of her proton pump inhibitor right now, and use something as needed.  Cough: Present for several days now, moderate, persistent without shortness of breath, fevers, chills, night sweats, weight loss, chest pain. Her daughter is recently getting over RSV infection.  Past medical history, Surgical history, Family history not pertinant except as noted below, Social history, Allergies, and medications have been entered into the medical record, reviewed, and no changes needed.   Review of Systems: No fevers, chills, night sweats, weight loss, chest pain, or shortness of breath.   Objective:    General: Well Developed, well nourished, and in no acute distress.  Neuro: Alert and oriented x3, extra-ocular muscles intact, sensation grossly intact.  HEENT: Normocephalic, atraumatic, pupils equal round reactive to light, neck supple, no masses, no lymphadenopathy, thyroid nonpalpable. There are a couple of right-sided trapezial trigger points. Skin: Warm and dry, no rashes. Cardiac: Regular rate and rhythm, no murmurs rubs or gallops, no lower extremity edema.  Respiratory: Clear to auscultation bilaterally. Not using accessory muscles, speaking in full sentences. Thoracic spine: There is a palpable area of spasm at approximately the level of T10 on the left side, minimally tender to palpation, good lower extremity reflexes, no signs of infection.  Procedure:  Injection of 2 right paracervical and one left parathoracic trigger point Consent  obtained and verified. Time-out conducted. Noted no overlying erythema, induration, or other signs of local infection. Skin prepped in a sterile fashion. Topical analgesic spray: Ethyl chloride. Completed without difficulty. Meds: A total of 1 cc Kenalog 40, 1 cc lidocaine spread out between the above 3 trigger points in a fanlike pattern. Pain immediately improved suggesting accurate placement of the medication. Advised to call if fevers/chills, erythema, induration, drainage, or persistent bleeding.  X-rays reviewed and show mild dextroscoliosis without any signs of degenerative disc disease.  Impression and Recommendations:

## 2014-03-21 NOTE — Assessment & Plan Note (Addendum)
Left-sided parathoracic muscle spasm. Trigger point injections x3 Celebrex. X-rays. Switching to ranitidine for acid blockade.

## 2014-03-21 NOTE — Assessment & Plan Note (Signed)
Overall doing well, continue tramadol. Switching to Celebrex. We have not yet done epidurals.

## 2014-03-21 NOTE — Assessment & Plan Note (Signed)
Tussionex. Return as needed.

## 2014-04-18 ENCOUNTER — Encounter: Payer: Self-pay | Admitting: Sports Medicine

## 2014-04-18 ENCOUNTER — Ambulatory Visit (INDEPENDENT_AMBULATORY_CARE_PROVIDER_SITE_OTHER): Payer: 59 | Admitting: Sports Medicine

## 2014-04-18 VITALS — BP 106/72 | HR 56 | Ht 64.0 in | Wt 142.0 lb

## 2014-04-18 DIAGNOSIS — M542 Cervicalgia: Secondary | ICD-10-CM

## 2014-04-18 DIAGNOSIS — M546 Pain in thoracic spine: Secondary | ICD-10-CM

## 2014-04-18 NOTE — Assessment & Plan Note (Signed)
Resolved with trigger point injections, Celebrex and occasional tramadol.

## 2014-04-18 NOTE — Progress Notes (Signed)
  Subjective:    CC: Follow-up  HPI: April RiegerLaura returns, she has cervical radiculitis and had some thoracic para still only uses an occasional tramadol. spinal spasm, this resolved after several trigger point injections. Happy with results so far.  Past medical history, Surgical history, Family history not pertinant except as noted below, Social history, Allergies, and medications have been entered into the medical record, reviewed, and no changes needed.   Review of Systems: No fevers, chills, night sweats, weight loss, chest pain, or shortness of breath.   Objective:    General: Well Developed, well nourished, and in no acute distress.  Neuro: Alert and oriented x3, extra-ocular muscles intact, sensation grossly intact.  HEENT: Normocephalic, atraumatic, pupils equal round reactive to light, neck supple, no masses, no lymphadenopathy, thyroid nonpalpable.  Skin: Warm and dry, no rashes. Cardiac: Regular rate and rhythm, no murmurs rubs or gallops, no lower extremity edema.  Respiratory: Clear to auscultation bilaterally. Not using accessory muscles, speaking in full sentences. Neck: Negative spurling's Full neck range of motion Grip strength and sensation normal in bilateral hands Strength good C4 to T1 distribution No sensory change to C4 to T1 Reflexes normal  Impression and Recommendations:

## 2014-04-18 NOTE — Assessment & Plan Note (Signed)
Resolved with trigger point injections, Celebrex and occasional tramadol. 

## 2014-04-24 ENCOUNTER — Ambulatory Visit (INDEPENDENT_AMBULATORY_CARE_PROVIDER_SITE_OTHER): Payer: 59 | Admitting: Obstetrics & Gynecology

## 2014-04-24 ENCOUNTER — Encounter: Payer: Self-pay | Admitting: Obstetrics & Gynecology

## 2014-04-24 VITALS — BP 108/69 | HR 89 | Resp 16 | Ht 64.0 in | Wt 143.0 lb

## 2014-04-24 DIAGNOSIS — Z124 Encounter for screening for malignant neoplasm of cervix: Secondary | ICD-10-CM

## 2014-04-24 DIAGNOSIS — Z1151 Encounter for screening for human papillomavirus (HPV): Secondary | ICD-10-CM

## 2014-04-24 DIAGNOSIS — N921 Excessive and frequent menstruation with irregular cycle: Secondary | ICD-10-CM

## 2014-04-24 DIAGNOSIS — Z01419 Encounter for gynecological examination (general) (routine) without abnormal findings: Secondary | ICD-10-CM

## 2014-04-24 MED ORDER — VALACYCLOVIR HCL 500 MG PO TABS: 500.0000 mg | ORAL_TABLET | Freq: Two times a day (BID) | ORAL | Status: DC

## 2014-04-24 NOTE — Patient Instructions (Signed)
Endometrial Biopsy Endometrial biopsy is a procedure in which a tissue sample is taken from inside the uterus. The tissue sample is then looked at under a microscope to see if the tissue is normal or abnormal. The endometrium is the lining of the uterus. This procedure helps determine where you are in your menstrual cycle and how hormone levels are affecting the lining of the uterus. This procedure may also be used to evaluate uterine bleeding or to diagnose endometrial cancer, tuberculosis, polyps, or inflammatory conditions.  LET YOUR HEALTH CARE PROVIDER KNOW ABOUT:  Any allergies you have.  All medicines you are taking, including vitamins, herbs, eye drops, creams, and over-the-counter medicines.  Previous problems you or members of your family have had with the use of anesthetics.  Any blood disorders you have.  Previous surgeries you have had.  Medical conditions you have.  Possibility of pregnancy. RISKS AND COMPLICATIONS Generally, this is a safe procedure. However, as with any procedure, complications can occur. Possible complications include:  Bleeding.  Pelvic infection.  Puncture of the uterine wall with the biopsy device (rare). BEFORE THE PROCEDURE   Keep a record of your menstrual cycles as directed by your health care provider. You may need to schedule your procedure for a specific time in your cycle.  You may want to bring a sanitary pad to wear home after the procedure.  Arrange for someone to drive you home after the procedure if you will be given a medicine to help you relax (sedative). PROCEDURE   You may be given a sedative to relax you.  You will lie on an exam table with your feet and legs supported as in a pelvic exam.  Your health care provider will insert an instrument (speculum) into your vagina to see your cervix.  Your cervix will be cleansed with an antiseptic solution. A medicine (local anesthetic) will be used to numb the cervix.  A forceps  instrument (tenaculum) will be used to hold your cervix steady for the biopsy.  A thin, rodlike instrument (uterine sound) will be inserted through your cervix to determine the length of your uterus and the location where the biopsy sample will be removed.  A thin, flexible tube (catheter) will be inserted through your cervix and into the uterus. The catheter is used to collect the biopsy sample from your endometrial tissue.  The catheter and speculum will then be removed, and the tissue sample will be sent to a lab for examination. AFTER THE PROCEDURE  You will rest in a recovery area until you are ready to go home.  You may have mild cramping and a small amount of vaginal bleeding for a few days after the procedure. This is normal.  Make sure you find out how to get your test results. Document Released: 10/07/2004 Document Revised: 02/06/2013 Document Reviewed: 11/21/2012 [employer] Patient Information 2015 [employer]. This information is not intended to replace advice given to you by your health care provider. Make sure you discuss any questions you have with your health care provider.  

## 2014-04-24 NOTE — Progress Notes (Signed)
  Subjective:     April PutnamLaura B Craun is a 38 y.o. female here for a routine exam.  Current complaints: right sided pain with menses, bleeding for 7 days between periods the last 3 months.  Personal health questionnaire reviewed: yes.   Gynecologic History Patient's last menstrual period was 04/17/2014. Contraception: tubal ligation Last Pap: 2013. Results were: normal Last mammogram: none  Obstetric History OB History  Gravida Para Term Preterm AB SAB TAB Ectopic Multiple Living  3 3 3            # Outcome Date GA Lbr Len/2nd Weight Sex Delivery Anes PTL Lv  3 Term           2 Term           1 Term                The following portions of the patient's history were reviewed and updated as appropriate: allergies, current medications, past family history, past medical history, past social history, past surgical history and problem list.  Review of Systems Pertinent items are noted in HPI.    Objective:      Filed Vitals:   04/24/14 1424  BP: 108/69  Pulse: 89  Resp: 16  Height: 5\' 4"  (1.626 m)  Weight: 143 lb (64.864 kg)   Vitals:  WNL General appearance: alert, cooperative and no distress Head: Normocephalic, without obvious abnormality, atraumatic Eyes: negative Throat: lips, mucosa, and tongue normal; teeth and gums normal Lungs: clear to auscultation bilaterally Breasts: normal appearance, no masses or tenderness, No nipple retraction or dimpling, No nipple discharge or bleeding Heart: regular rate and rhythm Abdomen: soft, non-tender; bowel sounds normal; no masses,  no organomegaly  Pelvic:  External Genitalia:  Tanner V, no lesion Urethra:  No prolapse Vagina:  Pink, normal rugae, no blood or discharge Cervix:  No CMT, no lesion Uterus:  Normal size and contour, non tender, difficult to feel  Adnexa:  Normal, no masses, non tender  Extremities: no edema, redness or tenderness in the calves or thighs Skin: no lesions or rash Lymph nodes: Axillary adenopathy:  none        Assessment:    Healthy female exam.   Menometrorrhagia Right sided pain with menses    Plan:    Follow up in: 2 weeks.    Cbc, tsh, prolactin Pelvic US Endometrial biopsy

## 2014-04-25 LAB — CBC
HEMATOCRIT: 38.3 % (ref 36.0–46.0)
Hemoglobin: 13.1 g/dL (ref 12.0–15.0)
MCH: 29.6 pg (ref 26.0–34.0)
MCHC: 34.2 g/dL (ref 30.0–36.0)
MCV: 86.5 fL (ref 78.0–100.0)
PLATELETS: 256 10*3/uL (ref 150–400)
RBC: 4.43 MIL/uL (ref 3.87–5.11)
RDW: 14.2 % (ref 11.5–15.5)
WBC: 8 10*3/uL (ref 4.0–10.5)

## 2014-04-25 LAB — PROLACTIN: PROLACTIN: 7.4 ng/mL

## 2014-04-25 LAB — TSH: TSH: 1.663 u[IU]/mL (ref 0.350–4.500)

## 2014-04-28 ENCOUNTER — Telehealth: Payer: Self-pay | Admitting: *Deleted

## 2014-04-28 NOTE — Telephone Encounter (Signed)
-----   Message from Lesly DukesKelly H Leggett, MD sent at 04/28/2014 12:50 PM EST ----- Call the patient and let them know their lab results are normal.  Thanks!!

## 2014-04-28 NOTE — Telephone Encounter (Signed)
Called pt to adv labs nrml and confirm appt for endo bx. Pt confirmed u/s appt and endo bx appt.

## 2014-04-30 LAB — CYTOLOGY - PAP

## 2014-05-06 ENCOUNTER — Ambulatory Visit (HOSPITAL_COMMUNITY)
Admission: RE | Admit: 2014-05-06 | Discharge: 2014-05-06 | Disposition: A | Discharge status: Home / Self Care | Payer: 59 | Source: Ambulatory Visit | Attending: Obstetrics & Gynecology | Admitting: Obstetrics & Gynecology

## 2014-05-06 DIAGNOSIS — N921 Excessive and frequent menstruation with irregular cycle: Secondary | ICD-10-CM | POA: Diagnosis present

## 2014-05-06 DIAGNOSIS — N949 Unspecified condition associated with female genital organs and menstrual cycle: Secondary | ICD-10-CM | POA: Insufficient documentation

## 2014-05-06 DIAGNOSIS — Z01419 Encounter for gynecological examination (general) (routine) without abnormal findings: Secondary | ICD-10-CM

## 2014-05-07 ENCOUNTER — Other Ambulatory Visit: Payer: Self-pay

## 2014-05-07 MED ORDER — TRAMADOL HCL 50 MG PO TABS: ORAL_TABLET | ORAL | Status: DC

## 2014-05-12 ENCOUNTER — Telehealth: Payer: Self-pay | Admitting: *Deleted

## 2014-05-12 NOTE — Telephone Encounter (Signed)
Pt notified of normal U/S per Dr Penne LashLeggett.  She does have appt tomorrow for Endo Bx.

## 2014-05-12 NOTE — Telephone Encounter (Signed)
-----   Message from Lesly DukesKelly H Leggett, MD sent at 05/10/2014  6:05 AM EST ----- Call the patient and let her know her US was nml.  There were no fibroids nor cysts.l.  Thanks!!

## 2014-05-13 ENCOUNTER — Encounter: Payer: Self-pay | Admitting: Obstetrics & Gynecology

## 2014-05-13 ENCOUNTER — Ambulatory Visit (INDEPENDENT_AMBULATORY_CARE_PROVIDER_SITE_OTHER): Payer: 59 | Admitting: Obstetrics & Gynecology

## 2014-05-13 VITALS — BP 100/65 | HR 74 | Resp 16 | Ht 64.0 in | Wt 138.0 lb

## 2014-05-13 DIAGNOSIS — Z01812 Encounter for preprocedural laboratory examination: Secondary | ICD-10-CM

## 2014-05-13 DIAGNOSIS — N939 Abnormal uterine and vaginal bleeding, unspecified: Secondary | ICD-10-CM

## 2014-05-13 LAB — POCT URINE PREGNANCY: Preg Test, Ur: NEGATIVE

## 2014-05-13 NOTE — Progress Notes (Signed)
ENDOMETRIAL BIOPSY     The indications for endometrial biopsy were reviewed.   Risks of the biopsy including cramping, bleeding, infection, uterine perforation, inadequate specimen and need for additional procedures  were discussed. The patient states she understands and agrees to undergo procedure today. Consent was signed. Time out was performed. Urine HCG was negative. A sterile speculum was placed in the patient's vagina and the cervix was prepped with Betadine. A single-toothed tenaculum was placed on the anterior lip of the cervix to stabilize it. The 3 mm pipelle was introduced into the endometrial cavity without difficulty to a depth of 9cm, and a moderate amount of tissue was obtained and sent to pathology. The instruments were removed from the patient's vagina. Minimal bleeding from the cervix was noted. The patient tolerated the procedure well. Routine post-procedure instructions were given to the patient. The patient will follow up to review the results and for further management.    Will discuss results and plan at next visit. Nml pelvic [place]

## 2014-05-14 ENCOUNTER — Encounter: Payer: Self-pay | Admitting: *Deleted

## 2014-05-19 ENCOUNTER — Telehealth: Payer: Self-pay | Admitting: *Deleted

## 2014-05-19 NOTE — Telephone Encounter (Signed)
Lm on voicemail of neg biopsy and needs to call office to make a follow-up appt with Dr Penne LashLeggett.

## 2014-05-19 NOTE — Telephone Encounter (Signed)
-----   Message from Lesly DukesKelly H Leggett, MD sent at 05/19/2014  9:03 AM EST ----- Negative biopsy.  Please call with results.  Have her come in for plan.

## 2014-05-27 ENCOUNTER — Encounter: Payer: Self-pay | Admitting: Obstetrics & Gynecology

## 2014-05-27 ENCOUNTER — Ambulatory Visit (INDEPENDENT_AMBULATORY_CARE_PROVIDER_SITE_OTHER): Payer: 59 | Admitting: Obstetrics & Gynecology

## 2014-05-27 VITALS — BP 126/79 | HR 72 | Resp 16 | Ht 64.0 in | Wt 138.0 lb

## 2014-05-27 DIAGNOSIS — N946 Dysmenorrhea, unspecified: Secondary | ICD-10-CM

## 2014-05-27 MED ORDER — ALPRAZOLAM 0.5 MG PO TABS: ORAL_TABLET | ORAL | Status: DC

## 2014-05-27 MED ORDER — MISOPROSTOL 200 MCG PO TABS: ORAL_TABLET | ORAL | Status: DC

## 2014-05-27 NOTE — Progress Notes (Signed)
Patient presents for test results and plan.  US is negative and endometrial biopsy is negative.  Pt has some irregular cycles and dysmenorrhea.  We discussed Mirena and ablation.   Pt prefers to not try OCPs, nexplanon or depo.  Given her age of [age], I recommend the Mirena.  It will make her periods light and helps dysmenorrhea.  If she is not satisfied then ablation could be next option.  Pt is agreeable to this treatment plan.    Pt will come in for Mirena with menses and premedicate with xanax and cytotec.  Pt has a narrow os.  Christean GriefSkyla could be an option if unable to get the Mirena in.    All questions answered.  Pt will return in January with next menses.    25 minutes spent with patient face to face with >50% consulting

## 2014-06-18 ENCOUNTER — Other Ambulatory Visit: Payer: Self-pay

## 2014-06-18 MED ORDER — TRAMADOL HCL 50 MG PO TABS: ORAL_TABLET | ORAL | Status: DC

## 2014-07-23 ENCOUNTER — Other Ambulatory Visit: Payer: Self-pay | Admitting: Emergency Medicine

## 2014-07-23 MED ORDER — TRAMADOL HCL 50 MG PO TABS: ORAL_TABLET | ORAL | Status: DC

## 2014-08-04 ENCOUNTER — Encounter: Payer: Self-pay | Admitting: Obstetrics & Gynecology

## 2014-08-04 ENCOUNTER — Other Ambulatory Visit: Payer: Self-pay | Admitting: Obstetrics & Gynecology

## 2014-08-04 ENCOUNTER — Ambulatory Visit (INDEPENDENT_AMBULATORY_CARE_PROVIDER_SITE_OTHER): Payer: 59 | Admitting: Obstetrics & Gynecology

## 2014-08-04 VITALS — BP 125/88 | HR 122 | Resp 16 | Ht 64.0 in | Wt 128.0 lb

## 2014-08-04 DIAGNOSIS — N898 Other specified noninflammatory disorders of vagina: Secondary | ICD-10-CM

## 2014-08-04 DIAGNOSIS — Z0000 Encounter for general adult medical examination without abnormal findings: Secondary | ICD-10-CM

## 2014-08-04 MED ORDER — METRONIDAZOLE 500 MG PO TABS: 500.0000 mg | ORAL_TABLET | Freq: Two times a day (BID) | ORAL | Status: DC

## 2014-08-04 NOTE — Progress Notes (Signed)
   Subjective:    Patient ID: April Nielsen, female    DOB: 30-Nov-1975, 39 y.o.   MRN: [id number]  HPI  39 yo SW P3 ( 5,11,38yo daughters) here today for STI testing. Her boyfriend of 15 years had an affair a couple of years ago. She also has another partner currently and didn't use condoms recently. She noticed a vaginal odor about 2 months ago.   Review of Systems     Objective:   Physical Exam [id number] Breathing normally Neuro intact Smells of cigarettes  Speculum exam reveals frothy discharge c/w BV       Assessment & Plan:  Preventative care- STI testing Vaginal discharge- I suspect BV and will treat with flagyl. If her other tests show something else, I will treat accordingly.

## 2014-08-04 NOTE — Addendum Note (Signed)
Addended by: Arne ClevelandHUTCHINSON, Orlinda Slomski J on: 08/04/2014 03:09 PM   Modules accepted: Orders

## 2014-08-05 LAB — HIV ANTIBODY (ROUTINE TESTING W REFLEX): HIV 1&2 Ab, 4th Generation: NONREACTIVE

## 2014-08-05 LAB — WET PREP, GENITAL
Trich, Wet Prep: NONE SEEN
WBC, Wet Prep HPF POC: NONE SEEN
YEAST WET PREP: NONE SEEN

## 2014-08-05 LAB — HEPATITIS C ANTIBODY: HCV AB: NEGATIVE

## 2014-08-05 LAB — GC/CHLAMYDIA PROBE AMP
CT Probe RNA: NEGATIVE
GC PROBE AMP APTIMA: NEGATIVE

## 2014-08-05 LAB — RPR

## 2014-08-05 LAB — HEPATITIS B SURFACE ANTIGEN: Hepatitis B Surface Ag: NEGATIVE

## 2014-09-01 ENCOUNTER — Other Ambulatory Visit: Payer: Self-pay

## 2014-09-01 MED ORDER — TRAMADOL HCL 50 MG PO TABS: ORAL_TABLET | ORAL | Status: DC

## 2014-10-13 ENCOUNTER — Other Ambulatory Visit: Payer: Self-pay | Admitting: Sports Medicine

## 2014-10-16 ENCOUNTER — Telehealth: Payer: Self-pay

## 2014-10-16 MED ORDER — TRAMADOL HCL 50 MG PO TABS: ORAL_TABLET | ORAL | Status: DC

## 2014-10-16 NOTE — Telephone Encounter (Signed)
Reprinted Rx Tramadol for patient. Rhonda Cunningham,CMA

## 2014-10-28 ENCOUNTER — Encounter: Payer: Self-pay | Admitting: Physician Assistant

## 2014-10-28 ENCOUNTER — Ambulatory Visit (INDEPENDENT_AMBULATORY_CARE_PROVIDER_SITE_OTHER): Payer: 59 | Admitting: Physician Assistant

## 2014-10-28 VITALS — BP 124/87 | HR 76 | Temp 98.3°F | Ht 64.0 in | Wt 119.0 lb

## 2014-10-28 DIAGNOSIS — F410 Panic disorder [episodic paroxysmal anxiety] without agoraphobia: Secondary | ICD-10-CM | POA: Diagnosis not present

## 2014-10-28 DIAGNOSIS — R634 Abnormal weight loss: Secondary | ICD-10-CM

## 2014-10-28 DIAGNOSIS — R61 Generalized hyperhidrosis: Secondary | ICD-10-CM

## 2014-10-28 DIAGNOSIS — F4323 Adjustment disorder with mixed anxiety and depressed mood: Secondary | ICD-10-CM

## 2014-10-28 DIAGNOSIS — F419 Anxiety disorder, unspecified: Secondary | ICD-10-CM | POA: Insufficient documentation

## 2014-10-28 LAB — COMPLETE METABOLIC PANEL WITH GFR
ALBUMIN: 4.5 g/dL (ref 3.5–5.2)
ALT: 9 U/L (ref 0–35)
AST: 10 U/L (ref 0–37)
Alkaline Phosphatase: 28 U/L — ABNORMAL LOW (ref 39–117)
BILIRUBIN TOTAL: 0.8 mg/dL (ref 0.2–1.2)
BUN: 7 mg/dL (ref 6–23)
CHLORIDE: 104 meq/L (ref 96–112)
CO2: 24 meq/L (ref 19–32)
Calcium: 9.6 mg/dL (ref 8.4–10.5)
Creat: 0.41 mg/dL — ABNORMAL LOW (ref 0.50–1.10)
GLUCOSE: 81 mg/dL (ref 70–99)
POTASSIUM: 3.7 meq/L (ref 3.5–5.3)
SODIUM: 140 meq/L (ref 135–145)
TOTAL PROTEIN: 6.9 g/dL (ref 6.0–8.3)

## 2014-10-28 LAB — TSH: TSH: 2.109 u[IU]/mL (ref 0.350–4.500)

## 2014-10-28 MED ORDER — ALPRAZOLAM 0.5 MG PO TABS: 0.5000 mg | ORAL_TABLET | Freq: Two times a day (BID) | ORAL | Status: DC | PRN

## 2014-10-28 MED ORDER — CITALOPRAM HYDROBROMIDE 10 MG PO TABS: 10.0000 mg | ORAL_TABLET | Freq: Every day | ORAL | Status: DC

## 2014-10-28 NOTE — Progress Notes (Signed)
   Subjective:    Patient ID: April Nielsen, female    DOB: 10-26-1975, 39 y.o.   MRN: [id number]  HPI Pt is a 39 yo female who presents to the clinic with anxiety, depression, stress, night sweats, nausea, weight loss. 1 year ago her husband cheated on her. She has not been able to get over it. This was not the first time he has cheated on her. She states she is over it but does not want to leave him because of what it will do to the children. She has now fallen in love with another man and she is torn. She cannot sleep, eat and just feel "out of control" all the time. No suicidal or homicidal thoughts. She is not seeing a counselor or psychiatrist. She has never been on any medications for anxiety or depression.    Review of Systems  All other systems reviewed and are negative.      Objective:   Physical Exam  Constitutional: She is oriented to person, place, and time. She appears well-developed and well-nourished.  HENT:  Head: Normocephalic and atraumatic.  Cardiovascular: Normal rate, regular rhythm and normal heart sounds.   Pulmonary/Chest: Effort normal and breath sounds normal. She has no wheezes.  Neurological: She is alert and oriented to person, place, and time.  Skin: Skin is dry.  Psychiatric:  Very tearful and emotional.           Assessment & Plan:  Adjustment disorder with mixed depression and anxiety/weight loss/night sweating/panic attacks/nausea- PHQ-9 was 21. GAD-7 for 18. Will start celexa at bedtime. Discussed side effects. Pt aware may take 4-6 weeks to become therapeutic. Recommended for sleep to try melatonin 1mg -10mg  1 hour before bed. Xanax for panic attacks. Pt aware of abuse potential. Will check tSH, cbc, cmp for symptoms to see if making anxiety worse.  Follow up with PCP in 4-6 weeks. Encouraged counseling and even with family together to transition through this difficult time.   Spent 30 minutes with patient and greater than 50 percent of visit spent  counseling pt regarding anxiety and depression.

## 2014-10-29 LAB — CBC WITH DIFFERENTIAL/PLATELET
Basophils Absolute: 0 10*3/uL (ref 0.0–0.1)
Basophils Relative: 0 % (ref 0–1)
EOS ABS: 0.1 10*3/uL (ref 0.0–0.7)
EOS PCT: 1 % (ref 0–5)
HEMATOCRIT: 40.7 % (ref 36.0–46.0)
HEMOGLOBIN: 14 g/dL (ref 12.0–15.0)
LYMPHS ABS: 1.5 10*3/uL (ref 0.7–4.0)
Lymphocytes Relative: 20 % (ref 12–46)
MCH: 29.7 pg (ref 26.0–34.0)
MCHC: 34.4 g/dL (ref 30.0–36.0)
MCV: 86.2 fL (ref 78.0–100.0)
MONOS PCT: 7 % (ref 3–12)
MPV: 10.1 fL (ref 8.6–12.4)
Monocytes Absolute: 0.5 10*3/uL (ref 0.1–1.0)
NEUTROS PCT: 72 % (ref 43–77)
Neutro Abs: 5.5 10*3/uL (ref 1.7–7.7)
Platelets: 234 10*3/uL (ref 150–400)
RBC: 4.72 MIL/uL (ref 3.87–5.11)
RDW: 13.8 % (ref 11.5–15.5)
WBC: 7.7 10*3/uL (ref 4.0–10.5)

## 2014-11-10 ENCOUNTER — Telehealth: Payer: Self-pay | Admitting: Sports Medicine

## 2014-11-10 NOTE — Telephone Encounter (Signed)
Pt came in to make you aware that you may receive a call from [employer] regarding her new position. They may want to verify that she is prescribed  alazopran by Lesly RubensteinJade and this may show up on her drug screen.

## 2014-11-10 NOTE — Telephone Encounter (Signed)
Okay, no problem, does she want a letter?

## 2014-11-21 ENCOUNTER — Ambulatory Visit (INDEPENDENT_AMBULATORY_CARE_PROVIDER_SITE_OTHER): Payer: 59 | Admitting: Sports Medicine

## 2014-11-21 ENCOUNTER — Encounter: Payer: Self-pay | Admitting: Sports Medicine

## 2014-11-21 VITALS — BP 117/78 | HR 74 | Ht 64.0 in | Wt 118.0 lb

## 2014-11-21 DIAGNOSIS — F4323 Adjustment disorder with mixed anxiety and depressed mood: Secondary | ICD-10-CM

## 2014-11-21 DIAGNOSIS — M542 Cervicalgia: Secondary | ICD-10-CM

## 2014-11-21 MED ORDER — VILAZODONE HCL 10 & 20 MG PO KIT: 1.0000 | PACK | Freq: Every day | ORAL | Status: DC

## 2014-11-21 MED ORDER — TRAMADOL HCL 50 MG PO TABS: ORAL_TABLET | ORAL | Status: DC

## 2014-11-21 MED ORDER — VILAZODONE HCL 10 & 20 & 40 MG PO KIT: PACK | ORAL | Status: DC

## 2014-11-21 MED ORDER — ALPRAZOLAM 0.5 MG PO TABS: 0.5000 mg | ORAL_TABLET | Freq: Two times a day (BID) | ORAL | Status: DC | PRN

## 2014-11-21 NOTE — Progress Notes (Signed)
  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 She has been started on Celexa, has started dating another man, but unfortunately is plagued with difficulty achieving orgasm. Her mood has improved since starting the medication. Now she only has moderate guilt, mild anhedonia, depressed mood, difficulty sleeping, fatigue, and poor appetite, and poor concentration. She denies any suicidal or homicidal ideation. She has moderate anxiety, nervousness, worry, and trouble relaxing, and only mild irritability.  Past medical history, Surgical history, Family history not pertinant except as noted below, Social history, Allergies, and medications have been entered into the medical record, reviewed, and no changes needed.   Review of Systems: No fevers, chills, night sweats, weight loss, chest pain, or shortness of breath.   Objective:    General: Well Developed, well nourished, and in no acute distress.  Neuro: Alert and oriented x3, extra-ocular muscles intact, sensation grossly intact.  HEENT: Normocephalic, atraumatic, pupils equal round reactive to light, neck supple, no masses, no lymphadenopathy, thyroid nonpalpable.  Skin: Warm and dry, no rashes. Cardiac: Regular rate and rhythm, no murmurs rubs or gallops, no lower extremity edema.  Respiratory: Clear to auscultation bilaterally. Not using accessory muscles, speaking in full sentences.  Impression and Recommendations:

## 2014-11-21 NOTE — Assessment & Plan Note (Signed)
After separation from her husband. Overall doing well with regards to mood that still tearful, and now with anorgasmia secondary to Celexa. We are going to switch to Viibryd. Return to see me in one month.

## 2014-11-21 NOTE — Assessment & Plan Note (Signed)
Refilling tramadol

## 2014-12-19 ENCOUNTER — Ambulatory Visit: Payer: 59 | Admitting: Sports Medicine

## 2014-12-24 ENCOUNTER — Telehealth: Payer: Self-pay

## 2014-12-24 ENCOUNTER — Encounter: Payer: Self-pay | Admitting: Sports Medicine

## 2014-12-24 ENCOUNTER — Ambulatory Visit (INDEPENDENT_AMBULATORY_CARE_PROVIDER_SITE_OTHER): Payer: 59 | Admitting: Sports Medicine

## 2014-12-24 VITALS — BP 116/83 | HR 112 | Ht 64.0 in | Wt 115.0 lb

## 2014-12-24 DIAGNOSIS — F4323 Adjustment disorder with mixed anxiety and depressed mood: Secondary | ICD-10-CM

## 2014-12-24 MED ORDER — VILAZODONE HCL 40 MG PO TABS: 40.0000 mg | ORAL_TABLET | Freq: Every day | ORAL | Status: DC

## 2014-12-24 MED ORDER — ALPRAZOLAM 0.5 MG PO TABS
0.5000 mg | ORAL_TABLET | Freq: Every day | ORAL | Status: DC | PRN
Start: 2014-12-24 — End: 2015-02-24

## 2014-12-24 NOTE — Assessment & Plan Note (Signed)
Slight improvement on 20 mg however has been out of medicine for 4 days. Anorgasmia has resolved. We are going to do the due diligence of ramping up to the maximum dose of 40 mg, if no improvement we will switch to Brintellix. She will call me at the 3 week point with her GAD7 and PHQ9 scores and I can switch her if insufficiently resolved. Adding a few 0.5 mg alprazolam for as needed use

## 2014-12-24 NOTE — Telephone Encounter (Signed)
April Nielsen is without insurance for another month. With the savings card for Viibryd the cost is still 300 dollars a month. She would like to try a cheaper medication.

## 2014-12-24 NOTE — Progress Notes (Signed)
  Subjective:    CC: Follow-up  HPI: April Nielsen returns, she has done well since switching to Viibryd achieving orgasm has significantly resolved, unfortunately she continues to feel some depressive symptoms, with moderate anxiety, trouble relaxing, mild worry, as well as irritability, she also endorses moderate depressed mood, changes in appetite, guilt, difficult concentrating and mild anhedonia, typically sleeping, poor energy. Denies any suicidal or homicidal radiation. She is currently only at 20 mg of  Viibryd.  Past medical history, Surgical history, Family history not pertinant except as noted below, Social history, Allergies, and medications have been entered into the medical record, reviewed, and no changes needed.   Review of Systems: No fevers, chills, night sweats, weight loss, chest pain, or shortness of breath.   Objective:    General: Well Developed, well nourished, and in no acute distress.  Neuro: Alert and oriented x3, extra-ocular muscles intact, sensation grossly intact.  HEENT: Normocephalic, atraumatic, pupils equal round reactive to light, neck supple, no masses, no lymphadenopathy, thyroid nonpalpable.  Skin: Warm and dry, no rashes. Cardiac: Regular rate and rhythm, no murmurs rubs or gallops, no lower extremity edema.  Respiratory: Clear to auscultation bilaterally. Not using accessory muscles, speaking in full sentences.  Impression and Recommendations:

## 2014-12-25 MED ORDER — VORTIOXETINE HBR 20 MG PO TABS: ORAL_TABLET | ORAL | Status: DC

## 2014-12-25 NOTE — Telephone Encounter (Signed)
Oh my goodness, lets switch to Brintellix, rx in box attached to discount coupon.  If unacceptable price we will go back to one of the older medications that may cause anorgasmia.

## 2014-12-25 NOTE — Telephone Encounter (Signed)
Dr. T please see note below. Rhonda Cunningham,CMA  

## 2014-12-25 NOTE — Assessment & Plan Note (Signed)
Slight improvement on 20 mg however has been out of medicine for 4 days. Anorgasmia has resolved. We are going to do the due diligence of ramping up to the maximum dose of 40 mg, if no improvement we will switch to Brintellix. She will call me at the 3 week point with her GAD7 and PHQ9 scores and I can switch her if insufficiently resolved. Adding a few 0.5 mg alprazolam for as needed use  Unable to afford Viibryd, trying Brintellix.

## 2014-12-25 NOTE — Telephone Encounter (Signed)
Spoke to patient gave her information as noted below. Patient stated that she already had the discount coupon and she requested that the Rx Brintellix be faxed to her pharmacy. Rx was [facility]. Rhonda Cunningham,CMA

## 2014-12-26 ENCOUNTER — Telehealth: Payer: Self-pay | Admitting: *Deleted

## 2014-12-26 DIAGNOSIS — F4323 Adjustment disorder with mixed anxiety and depressed mood: Secondary | ICD-10-CM

## 2014-12-26 MED ORDER — CITALOPRAM HYDROBROMIDE 20 MG PO TABS: 20.0000 mg | ORAL_TABLET | Freq: Every day | ORAL | Status: DC

## 2014-12-26 NOTE — Telephone Encounter (Signed)
Goodness, ok, sending celexa, when her insurance kicks in, the other ones will be more cost effective.

## 2014-12-26 NOTE — Telephone Encounter (Signed)
Pt called back today stating that even with the coupon card Brintellix is still going to cost her $300.  She said that she is ok with going back on the Celexa & she's not worried about the sexual side effects right now.  [facility].

## 2014-12-26 NOTE — Telephone Encounter (Signed)
LMOM notifying pt of rx. 

## 2014-12-29 ENCOUNTER — Other Ambulatory Visit: Payer: Self-pay | Admitting: *Deleted

## 2014-12-29 DIAGNOSIS — B009 Herpesviral infection, unspecified: Secondary | ICD-10-CM

## 2014-12-29 MED ORDER — VALACYCLOVIR HCL 500 MG PO TABS: 500.0000 mg | ORAL_TABLET | Freq: Two times a day (BID) | ORAL | Status: DC

## 2014-12-29 NOTE — Telephone Encounter (Signed)
Pt called getting ready to start a new job and wanting a Rf on Valtrex.  This was sent to [facility].

## 2015-01-21 ENCOUNTER — Encounter: Payer: Self-pay | Admitting: Sports Medicine

## 2015-01-21 ENCOUNTER — Ambulatory Visit (INDEPENDENT_AMBULATORY_CARE_PROVIDER_SITE_OTHER): Payer: Managed Care, Other (non HMO) | Admitting: Sports Medicine

## 2015-01-21 DIAGNOSIS — F4323 Adjustment disorder with mixed anxiety and depressed mood: Secondary | ICD-10-CM

## 2015-01-21 MED ORDER — VILAZODONE HCL 10 & 20 & 40 MG PO KIT: PACK | ORAL | Status: DC

## 2015-01-21 NOTE — Progress Notes (Signed)
  Subjective:    CC: Follow-up  HPI: This pleasant 39 year old female returns for follow-up of her anxiety and depression, we had a bit of a hurdle, initially she did relatively well on Viibryd, ultimately she was unable to afford the 40 mg dose, she had lost her insurance with her job change, we tried her on Celexa for a while which has been moderately effective, however does calm with the unfortunate side effect of anorgasmia. This point she does have insurance, and is eager to get back on another low side effect medication, and is willing to try been Telex. She does endorse severe changes in her appetite, essentially loss of appetite, mild depressed mood, difficulty sleeping, healed, and difficult concentrating, denies any suicidal or homicidal ideation, she also endorses mild nervousness, difficulty controlling her worry, worrying about different things, debility relaxing and sense of impending doom.  Past medical history, Surgical history, Family history not pertinant except as noted below, Social history, Allergies, and medications have been entered into the medical record, reviewed, and no changes needed.   Review of Systems: No fevers, chills, night sweats, weight loss, chest pain, or shortness of breath.   Objective:    General: Well Developed, well nourished, and in no acute distress.  Neuro: Alert and oriented x3, extra-ocular muscles intact, sensation grossly intact.  HEENT: Normocephalic, atraumatic, pupils equal round reactive to light, neck supple, no masses, no lymphadenopathy, thyroid nonpalpable.  Skin: Warm and dry, no rashes. Cardiac: Regular rate and rhythm, no murmurs rubs or gallops, no lower extremity edema.  Respiratory: Clear to auscultation bilaterally. Not using accessory muscles, speaking in full sentences.  Impression and Recommendations:

## 2015-01-21 NOTE — Assessment & Plan Note (Signed)
Has insurance now, switching back to Viibryd, starter pack given, she had a moderate response at 20 mg, we are going to take all the way up to 40. She will down taper the Celexa. If still insufficient response we can try Brintellix

## 2015-01-21 NOTE — Patient Instructions (Signed)
Decrease Celexa one half tab daily for a week then stop

## 2015-02-24 ENCOUNTER — Encounter: Payer: Self-pay | Admitting: Sports Medicine

## 2015-02-24 ENCOUNTER — Ambulatory Visit (INDEPENDENT_AMBULATORY_CARE_PROVIDER_SITE_OTHER): Payer: Managed Care, Other (non HMO) | Admitting: Sports Medicine

## 2015-02-24 VITALS — BP 112/79 | HR 109 | Ht 64.0 in | Wt 108.0 lb

## 2015-02-24 DIAGNOSIS — F4323 Adjustment disorder with mixed anxiety and depressed mood: Secondary | ICD-10-CM | POA: Diagnosis not present

## 2015-02-24 DIAGNOSIS — O903 Peripartum cardiomyopathy: Secondary | ICD-10-CM

## 2015-02-24 DIAGNOSIS — R61 Generalized hyperhidrosis: Secondary | ICD-10-CM | POA: Diagnosis not present

## 2015-02-24 MED ORDER — ALPRAZOLAM 0.5 MG PO TABS
0.5000 mg | ORAL_TABLET | Freq: Every day | ORAL | Status: DC | PRN
Start: 2015-02-24 — End: 2015-04-06

## 2015-02-24 MED ORDER — VILAZODONE HCL 40 MG PO TABS: 40.0000 mg | ORAL_TABLET | Freq: Every day | ORAL | Status: DC

## 2015-02-24 NOTE — Assessment & Plan Note (Signed)
Has since resolved per patient. Previous echoes have been negative, repeating echocardiogram and adding a BNP.

## 2015-02-24 NOTE — Assessment & Plan Note (Addendum)
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

## 2015-02-24 NOTE — Progress Notes (Signed)
  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 Overall she feels as though she is significantly better on Viibryd, I would like to go up on the dose.  Night sweats: Moderate, persistent. Worries that she may be starting menopause, her mother hit menopause in her low 59s.  Cycles have been irregular but she also has a history of PCO S.  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 Denies any paroxysmal nocturnal dyspnea or orthopnea.  Past medical history, Surgical history, Family history not pertinant except as noted below, Social history, Allergies, and medications have been entered into the medical record, reviewed, and no changes needed.   Review of Systems: No fevers, chills, night sweats, weight loss, chest pain, or shortness of breath.   Objective:    General: Well Developed, well nourished, and in no acute distress.  Neuro: Alert and oriented x3, extra-ocular muscles intact, sensation grossly intact.  HEENT: Normocephalic, atraumatic, pupils equal round reactive to light, neck supple, no masses, no lymphadenopathy, thyroid nonpalpable.  Skin: Warm and dry, no rashes. Cardiac: Regular rate and rhythm, no murmurs rubs or gallops, no lower extremity edema.  Respiratory: Clear to auscultation bilaterally. Not  using accessory muscles, speaking in full sentences.  Impression and Recommendations:    I spent 40 minutes with this patient, greater than 50% was face-to-face time counseling regarding the above diagnoses

## 2015-02-24 NOTE — Assessment & Plan Note (Signed)
Persistent symptoms on 20 mg of Viibryd, increasing to 40. PHQ9 and GAD7 at each visit.

## 2015-02-25 LAB — TSH: TSH: 0.965 u[IU]/mL (ref 0.350–4.500)

## 2015-02-25 LAB — LUTEINIZING HORMONE: LH: 1.5 m[IU]/mL

## 2015-02-25 LAB — TESTOSTERONE, FREE, TOTAL, SHBG
Sex Hormone Binding: 85 nmol/L (ref 17–124)
Testosterone, Free: 5.3 pg/mL (ref 0.6–6.8)
Testosterone-% Free: 0.9 % (ref 0.4–2.4)
Testosterone: 57 ng/dL (ref 10–70)

## 2015-02-25 LAB — PROLACTIN: Prolactin: 6.9 ng/mL

## 2015-02-25 LAB — PROGESTERONE: Progesterone: 14.3 ng/mL

## 2015-02-25 LAB — FOLLICLE STIMULATING HORMONE: FSH: 3.3 m[IU]/mL

## 2015-02-25 LAB — BRAIN NATRIURETIC PEPTIDE: Brain Natriuretic Peptide: 21.7 pg/mL (ref 0.0–100.0)

## 2015-02-26 LAB — QUANTIFERON TB GOLD ASSAY (BLOOD)
Interferon Gamma Release Assay: NEGATIVE
Mitogen value: >10 [IU]/mL
Quantiferon Nil Value: 0.03 [IU]/mL
Quantiferon Tb Ag Minus Nil Value: -0.01 IU/mL
TB Ag value: 0.02 IU/mL

## 2015-02-27 LAB — ESTROGENS, TOTAL: Estrogen: 306 pg/mL

## 2015-03-02 ENCOUNTER — Telehealth: Payer: Self-pay | Admitting: Sports Medicine

## 2015-03-02 ENCOUNTER — Other Ambulatory Visit: Payer: Self-pay | Admitting: Sports Medicine

## 2015-03-02 NOTE — Telephone Encounter (Signed)
Received fax for prior authorization on VIIBRYD 40 mg sent through cover my meds waiting on authorization. - CF

## 2015-03-03 ENCOUNTER — Ambulatory Visit (HOSPITAL_COMMUNITY): Payer: Managed Care, Other (non HMO)

## 2015-03-03 NOTE — Telephone Encounter (Signed)
Received fax from [employer] and medication is approved from 03/02/2015 - 03/01/2016 or until plan's supply limit ends. Case # [id number]. - CF

## 2015-03-20 ENCOUNTER — Other Ambulatory Visit: Payer: Self-pay | Admitting: Sports Medicine

## 2015-03-24 ENCOUNTER — Ambulatory Visit (INDEPENDENT_AMBULATORY_CARE_PROVIDER_SITE_OTHER): Payer: Managed Care, Other (non HMO) | Admitting: Sports Medicine

## 2015-03-24 DIAGNOSIS — F4323 Adjustment disorder with mixed anxiety and depressed mood: Secondary | ICD-10-CM

## 2015-03-24 NOTE — Progress Notes (Signed)
  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 She still endorses mild nervousness, difficulty controlling her worry, worrying about different things, trouble relaxing, restlessness and irritability, she also has mild poor energy, appetite, and difficulty concentrating, no suicidal or homicidal ideation.  Past medical history, Surgical history, Family history not pertinant except as noted below, Social history, Allergies, and medications have been entered into the medical record, reviewed, and no changes needed.   Review of Systems: No fevers, chills, night sweats, weight loss, chest pain, or shortness of breath.   Objective:    General: Well Developed, well nourished, and in no acute distress.  Neuro: Alert and oriented x3, extra-ocular muscles intact, sensation grossly intact.  HEENT: Normocephalic, atraumatic, pupils equal round reactive to light, neck supple, no masses, no lymphadenopathy, thyroid nonpalpable.  Skin: Warm and dry, no rashes. Cardiac: Regular rate and rhythm, no murmurs rubs or gallops, no lower extremity edema.  Respiratory: Clear to auscultation bilaterally. Not using accessory muscles, speaking in full sentences.  Impression and Recommendations:

## 2015-03-24 NOTE — Assessment & Plan Note (Signed)
Still on Celexa but now we have Viibryd covered. She will return after one month on fibroid for another PHQ9 and GAD7.

## 2015-04-06 ENCOUNTER — Other Ambulatory Visit: Payer: Self-pay

## 2015-04-06 DIAGNOSIS — F4323 Adjustment disorder with mixed anxiety and depressed mood: Secondary | ICD-10-CM

## 2015-04-06 MED ORDER — ALPRAZOLAM 0.5 MG PO TABS: 0.5000 mg | ORAL_TABLET | Freq: Every day | ORAL | Status: DC | PRN

## 2015-04-21 ENCOUNTER — Other Ambulatory Visit: Payer: Self-pay | Admitting: Sports Medicine

## 2015-04-21 ENCOUNTER — Encounter: Payer: Self-pay | Admitting: Sports Medicine

## 2015-04-21 ENCOUNTER — Ambulatory Visit (INDEPENDENT_AMBULATORY_CARE_PROVIDER_SITE_OTHER): Payer: Managed Care, Other (non HMO) | Admitting: Sports Medicine

## 2015-04-21 VITALS — BP 123/81 | HR 75 | Wt 114.0 lb

## 2015-04-21 DIAGNOSIS — F4323 Adjustment disorder with mixed anxiety and depressed mood: Secondary | ICD-10-CM | POA: Diagnosis not present

## 2015-04-21 DIAGNOSIS — L918 Other hypertrophic disorders of the skin: Secondary | ICD-10-CM | POA: Diagnosis not present

## 2015-04-21 DIAGNOSIS — J209 Acute bronchitis, unspecified: Secondary | ICD-10-CM

## 2015-04-21 MED ORDER — TRAMADOL HCL 50 MG PO TABS: ORAL_TABLET | ORAL | Status: DC

## 2015-04-21 MED ORDER — HYDROCOD POLST-CPM POLST ER 10-8 MG/5ML PO SUER: 5.0000 mL | Freq: Two times a day (BID) | ORAL | Status: DC | PRN

## 2015-04-21 MED ORDER — AZITHROMYCIN 250 MG PO TABS
ORAL_TABLET | ORAL | Status: DC
Start: 2015-04-21 — End: 2015-06-17

## 2015-04-21 NOTE — Progress Notes (Signed)
  Subjective:    CC: follow-up  HPI: Anxiety and depression: [name] has done well on 40 mg of fiber, she only has mild difficulty sleeping, poor appetite, guilt, difficulty concentrating and thoughts that she would be better off dead however she does contract for safety, she also has mild nervousness, difficulty controlling her worry, worrying about different things, trouble relaxing, and irritability. Overall she's happy with having to going on her current medication regimen and she has not needed any extra Xanax.  Skin lesions: Left-sided lateral eyelid as well as left axilla, desires aggressive surgical treatment today.  Coughing: Nonproductive, present for 2 weeks, moderate, persistent. No shortness of breath.  Past medical history, Surgical history, Family history not pertinant except as noted below, Social history, Allergies, and medications have been entered into the medical record, reviewed, and no changes needed.   Review of Systems: No fevers, chills, night sweats, weight loss, chest pain, or shortness of breath.   Objective:    General: Well Developed, well nourished, and in no acute distress.  Neuro: Alert and oriented x3, extra-ocular muscles intact, sensation grossly intact.  HEENT: Normocephalic, atraumatic, pupils equal round reactive to light, neck supple, no masses, no lymphadenopathy, thyroid nonpalpable. Oropharynx, nasopharynx, ear canals are unremarkable. Skin: Warm and dry, no rashes.there does appear to be a small hemangioma on the left lateral eyelid margin as well as a small skin tag in the left axilla Cardiac: Regular rate and rhythm, no murmurs rubs or gallops, no lower extremity edema.  Respiratory: Clear to auscultation bilaterally. Not using accessory muscles, speaking in full sentences.  Procedure:  Cryodestruction of left lateral eyelid hemangioma and left axillary skin tag Consent obtained and verified. Time-out conducted. Noted no overlying erythema,  induration, or other signs of local infection. Completed without difficulty using Cryo-Gun. Advised to call if fevers/chills, erythema, induration, drainage, or persistent bleeding.  Impression and Recommendations:

## 2015-04-21 NOTE — Assessment & Plan Note (Signed)
Doing extremely well, continue Viibryd 40 mg for 6 months.

## 2015-04-21 NOTE — Assessment & Plan Note (Signed)
Lungs are clear, present for 2 weeks, azithromycin, Tussionex.

## 2015-04-21 NOTE — Assessment & Plan Note (Signed)
Cryotherapy of skin tag on the left axilla and left lateral eyelid.

## 2015-05-22 ENCOUNTER — Other Ambulatory Visit: Payer: Self-pay

## 2015-05-22 DIAGNOSIS — F4323 Adjustment disorder with mixed anxiety and depressed mood: Secondary | ICD-10-CM

## 2015-05-22 MED ORDER — ALPRAZOLAM 0.5 MG PO TABS: 0.5000 mg | ORAL_TABLET | Freq: Every day | ORAL | Status: DC | PRN

## 2015-06-17 ENCOUNTER — Encounter: Payer: Self-pay | Admitting: Sports Medicine

## 2015-06-17 ENCOUNTER — Ambulatory Visit (INDEPENDENT_AMBULATORY_CARE_PROVIDER_SITE_OTHER): Payer: Managed Care, Other (non HMO)

## 2015-06-17 ENCOUNTER — Other Ambulatory Visit: Payer: Self-pay | Admitting: Sports Medicine

## 2015-06-17 ENCOUNTER — Ambulatory Visit (INDEPENDENT_AMBULATORY_CARE_PROVIDER_SITE_OTHER): Payer: Managed Care, Other (non HMO) | Admitting: Sports Medicine

## 2015-06-17 VITALS — BP 132/88 | HR 110 | Temp 98.4°F | Resp 18 | Wt 106.2 lb

## 2015-06-17 DIAGNOSIS — F4323 Adjustment disorder with mixed anxiety and depressed mood: Secondary | ICD-10-CM

## 2015-06-17 DIAGNOSIS — R05 Cough: Secondary | ICD-10-CM | POA: Diagnosis not present

## 2015-06-17 DIAGNOSIS — R059 Cough, unspecified: Secondary | ICD-10-CM

## 2015-06-17 DIAGNOSIS — J209 Acute bronchitis, unspecified: Secondary | ICD-10-CM

## 2015-06-17 MED ORDER — AZITHROMYCIN 250 MG PO TABS: ORAL_TABLET | ORAL | Status: DC

## 2015-06-17 MED ORDER — HYDROCOD POLST-CPM POLST ER 10-8 MG/5ML PO SUER: 5.0000 mL | Freq: Two times a day (BID) | ORAL | Status: DC | PRN

## 2015-06-17 NOTE — Progress Notes (Signed)
  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 Cough is minimally productive, no shortness of breath, no constitutional symptoms. No chest pain. Symptoms are moderate, persistent, no lower extremity swelling.  Past medical history, Surgical history, Family history not pertinant except as noted below, Social history, Allergies, and medications have been entered into the medical record, reviewed, and no changes needed.   Review of Systems: No fevers, chills, night sweats, weight loss, chest pain, or shortness of breath.   Objective:    General: Well Developed, well nourished, and in no acute distress.  Neuro: Alert and oriented x3, extra-ocular muscles intact, sensation grossly intact.  HEENT: Normocephalic, atraumatic, pupils equal round reactive to light, neck supple, no masses, no lymphadenopathy, thyroid nonpalpable.  Skin: Warm and dry, no rashes. Cardiac: Regular rate and rhythm, no murmurs rubs or gallops, no lower extremity edema.  Respiratory: Clear to auscultation bilaterally. Not using accessory muscles, speaking in full sentences. Coughing and exam room.  Chest x-ray is clear with the exception of increased bronchial markings.  Impression and Recommendations:   I spent 25 minutes with this patient, greater than 50% was face-to-face time counseling regarding the above diagnoses

## 2015-06-17 NOTE — Assessment & Plan Note (Signed)
Recurrence of symptoms, azithromycin, Tussionex. Chest x-ray is clear with the exception of increased bronchial markings. No infiltrate.

## 2015-06-17 NOTE — Assessment & Plan Note (Signed)
Has self discontinued Viibryd, and feeling good.

## 2015-06-26 ENCOUNTER — Other Ambulatory Visit: Payer: Self-pay

## 2015-06-26 DIAGNOSIS — F4323 Adjustment disorder with mixed anxiety and depressed mood: Secondary | ICD-10-CM

## 2015-06-26 MED ORDER — ALPRAZOLAM 0.5 MG PO TABS: 0.5000 mg | ORAL_TABLET | Freq: Every day | ORAL | Status: DC | PRN

## 2015-07-21 ENCOUNTER — Encounter: Payer: Self-pay | Admitting: Sports Medicine

## 2015-07-21 ENCOUNTER — Ambulatory Visit (INDEPENDENT_AMBULATORY_CARE_PROVIDER_SITE_OTHER): Payer: Managed Care, Other (non HMO) | Admitting: Sports Medicine

## 2015-07-21 VITALS — BP 120/66 | HR 82 | Resp 16 | Wt 113.1 lb

## 2015-07-21 DIAGNOSIS — M542 Cervicalgia: Secondary | ICD-10-CM

## 2015-07-21 DIAGNOSIS — Z299 Encounter for prophylactic measures, unspecified: Secondary | ICD-10-CM | POA: Diagnosis not present

## 2015-07-21 DIAGNOSIS — F4323 Adjustment disorder with mixed anxiety and depressed mood: Secondary | ICD-10-CM

## 2015-07-21 MED ORDER — TRAMADOL HCL 50 MG PO TABS: ORAL_TABLET | ORAL | Status: DC

## 2015-07-21 MED ORDER — ALPRAZOLAM 0.5 MG PO TABS: 0.5000 mg | ORAL_TABLET | Freq: Every day | ORAL | Status: DC | PRN

## 2015-07-21 NOTE — Progress Notes (Signed)
  Subjective:    CC: Follow-up  HPI: Bronchitis: Resolved  Anxiety: Is now off of all controller medications, needs a refill on Xanax but overall happy with how things are going. Her daughter was accepted into [facility] and so she is ecstatic about that right now. She is also getting back into dating and is happy with how things are progressing.  Preventive measures: Up-to-date on influenza vaccine, due for tetanus vaccination.  Past medical history, Surgical history, Family history not pertinant except as noted below, Social history, Allergies, and medications have been entered into the medical record, reviewed, and no changes needed.   Review of Systems: No fevers, chills, night sweats, weight loss, chest pain, or shortness of breath.   Objective:    General: Well Developed, well nourished, and in no acute distress.  Neuro: Alert and oriented x3, extra-ocular muscles intact, sensation grossly intact.  HEENT: Normocephalic, atraumatic, pupils equal round reactive to light, neck supple, no masses, no lymphadenopathy, thyroid nonpalpable.  Skin: Warm and dry, no rashes. Cardiac: Regular rate and rhythm, no murmurs rubs or gallops, no lower extremity edema.  Respiratory: Clear to auscultation bilaterally. Not using accessory muscles, speaking in full sentences.  Impression and Recommendations:

## 2015-07-21 NOTE — Assessment & Plan Note (Signed)
Doing well, refilling tramadol

## 2015-07-21 NOTE — Assessment & Plan Note (Signed)
Tetanus vaccine given today. 

## 2015-07-21 NOTE — Assessment & Plan Note (Signed)
Doing extremely well. Is off of all SSRIs. Needs a refill of alprazolam but happy with how things are going so far.

## 2015-07-22 ENCOUNTER — Ambulatory Visit: Payer: Managed Care, Other (non HMO) | Admitting: Sports Medicine

## 2015-08-26 ENCOUNTER — Other Ambulatory Visit: Payer: Self-pay

## 2015-08-26 DIAGNOSIS — F4323 Adjustment disorder with mixed anxiety and depressed mood: Secondary | ICD-10-CM

## 2015-08-26 MED ORDER — ALPRAZOLAM 0.5 MG PO TABS: 0.5000 mg | ORAL_TABLET | Freq: Every day | ORAL | Status: DC | PRN

## 2015-10-15 ENCOUNTER — Telehealth: Payer: Self-pay

## 2015-10-15 DIAGNOSIS — F4323 Adjustment disorder with mixed anxiety and depressed mood: Secondary | ICD-10-CM

## 2015-10-15 MED ORDER — ALPRAZOLAM 0.5 MG PO TABS: 0.5000 mg | ORAL_TABLET | Freq: Every day | ORAL | Status: DC | PRN

## 2015-10-15 NOTE — Telephone Encounter (Signed)
Done, Rx in box.

## 2015-10-16 NOTE — Telephone Encounter (Signed)
Notified patient that script sent to the pharmacy.

## 2015-11-18 ENCOUNTER — Other Ambulatory Visit: Payer: Self-pay | Admitting: Sports Medicine

## 2015-11-18 DIAGNOSIS — F4323 Adjustment disorder with mixed anxiety and depressed mood: Secondary | ICD-10-CM

## 2015-11-18 MED ORDER — ALPRAZOLAM 0.5 MG PO TABS: 0.5000 mg | ORAL_TABLET | Freq: Every day | ORAL | Status: DC | PRN

## 2015-12-30 ENCOUNTER — Other Ambulatory Visit: Payer: Self-pay

## 2015-12-30 DIAGNOSIS — F4323 Adjustment disorder with mixed anxiety and depressed mood: Secondary | ICD-10-CM

## 2015-12-30 MED ORDER — ALPRAZOLAM 0.5 MG PO TABS: 0.5000 mg | ORAL_TABLET | Freq: Every day | ORAL | Status: DC | PRN

## 2016-01-18 ENCOUNTER — Encounter: Payer: Self-pay | Admitting: Sports Medicine

## 2016-01-18 ENCOUNTER — Ambulatory Visit (INDEPENDENT_AMBULATORY_CARE_PROVIDER_SITE_OTHER): Payer: Managed Care, Other (non HMO) | Admitting: Sports Medicine

## 2016-01-18 DIAGNOSIS — F4323 Adjustment disorder with mixed anxiety and depressed mood: Secondary | ICD-10-CM

## 2016-01-18 MED ORDER — VALACYCLOVIR HCL 1 G PO TABS: 1000.0000 mg | ORAL_TABLET | Freq: Two times a day (BID) | ORAL | 11 refills | Status: DC

## 2016-01-18 MED ORDER — TRAMADOL HCL 50 MG PO TABS: ORAL_TABLET | ORAL | 3 refills | Status: DC

## 2016-01-18 MED ORDER — ALPRAZOLAM 0.5 MG PO TABS: 0.5000 mg | ORAL_TABLET | Freq: Every day | ORAL | 0 refills | Status: DC | PRN

## 2016-01-18 NOTE — Progress Notes (Signed)
  Subjective:    CC: medication re-fill   HPI: 40 yo F with history of depression, anxiety presenting for refill of her Tramadol, Xanax, and Valtrex.  She has been doing well since her last appointment in January.  Her mood and anxiety continue to be doing well off of medication with exception of occasional Xanax.  Her job is going well.  She would like refill of her Tramadol, which she takes before bed for neck pain. She also would like a refill of Xanax for anxiety and Valtrex for occasionally breakouts that she gets.   Past medical history, Surgical history, Family history not pertinant except as noted below, Social history, Allergies, and medications have been entered into the medical record, reviewed, and no changes needed.   Review of Systems: No fevers, chills, night sweats, weight loss, chest pain, or shortness of breath.   Objective:    General: Well Developed, well nourished, and in no acute distress.  Neuro: Alert and oriented x3, extra-ocular muscles intact, sensation grossly intact.  HEENT: Normocephalic, atraumatic, pupils equal round reactive to light, neck supple, no masses, no lymphadenopathy, thyroid nonpalpable.  Skin: Warm and dry, no rashes. Cardiac: Regular rate and rhythm, no murmurs rubs or gallops, no lower extremity edema.  Respiratory: Clear to auscultation bilaterally. Not using accessory muscles, speaking in full sentences.   Impression and Recommendations:   1. Anxiety, depression -Both well-controlled.  GAD-7 score of 5.  PHQ-9 score of two.  -Continue Xanax PRN   2. Medication refill -Will refill Valtrex and Tramadol  3. Health maintenance -return in 1 year for yearly physical

## 2016-01-18 NOTE — Assessment & Plan Note (Signed)
Things are going significantly better, off of all medications except for occasional alprazolam. Refilling this and she can return as needed, off of all control her medications.

## 2016-02-17 ENCOUNTER — Other Ambulatory Visit: Payer: Self-pay

## 2016-02-17 DIAGNOSIS — F4323 Adjustment disorder with mixed anxiety and depressed mood: Secondary | ICD-10-CM

## 2016-02-17 MED ORDER — ALPRAZOLAM 0.5 MG PO TABS: 0.5000 mg | ORAL_TABLET | Freq: Every day | ORAL | 2 refills | Status: DC | PRN

## 2016-04-08 ENCOUNTER — Telehealth: Payer: Self-pay

## 2016-04-08 DIAGNOSIS — F4323 Adjustment disorder with mixed anxiety and depressed mood: Secondary | ICD-10-CM

## 2016-04-08 MED ORDER — ALPRAZOLAM 0.5 MG PO TABS: 0.5000 mg | ORAL_TABLET | Freq: Two times a day (BID) | ORAL | 2 refills | Status: DC | PRN

## 2016-04-08 NOTE — Telephone Encounter (Signed)
Notified. 

## 2016-04-08 NOTE — Telephone Encounter (Signed)
Pt would like to know if her xanax can be bumped up to twice a day. Please advise.

## 2016-04-08 NOTE — Telephone Encounter (Signed)
Okay, refill in box

## 2016-06-15 IMAGING — CR DG THORACIC SPINE 3V
3 series · 3 of 3 positions shown · non-contrast
Comparison: 03/10/2009 chest CT

CLINICAL DATA: Left mid to lower back pain.  Heavy lifting at work.

EXAM:
THORACIC SPINE - 2 VIEW + SWIMMERS

[view not recorded (1 of 3)]
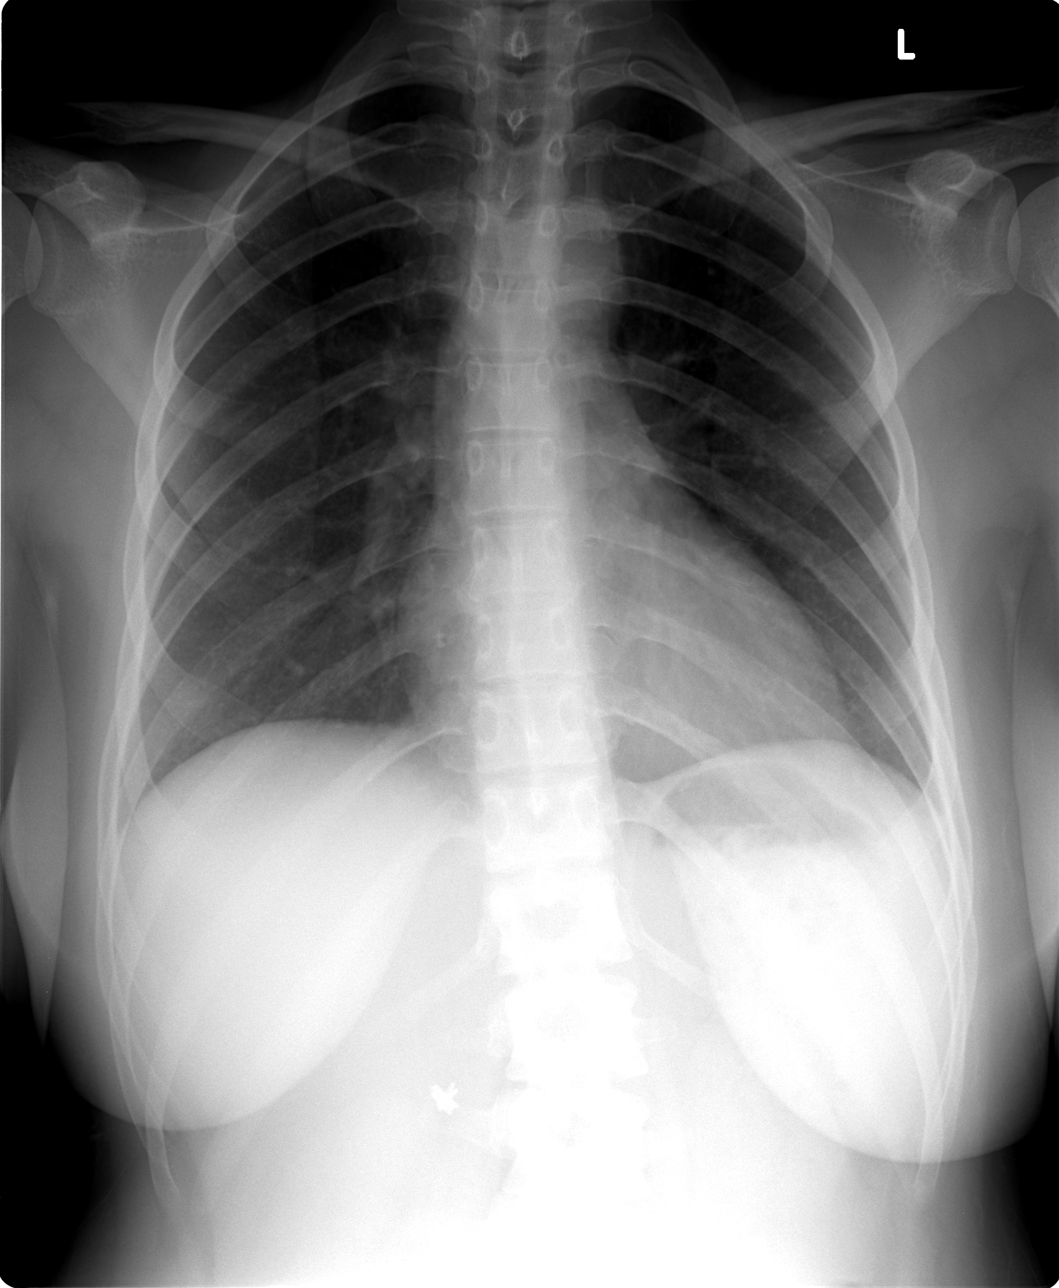

[view not recorded (2 of 3)]
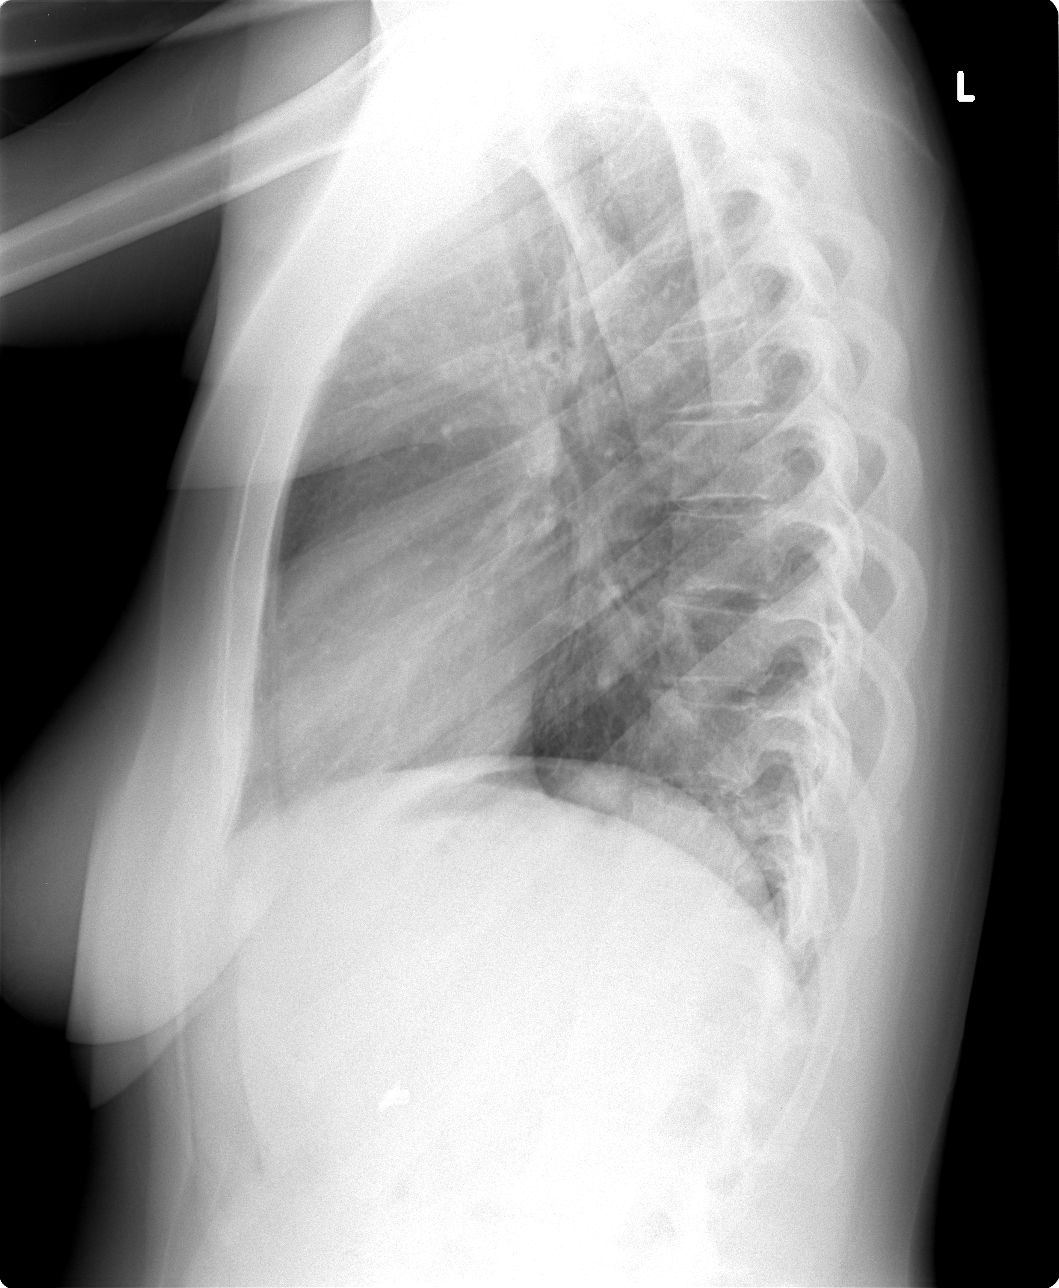

[view not recorded (3 of 3)]
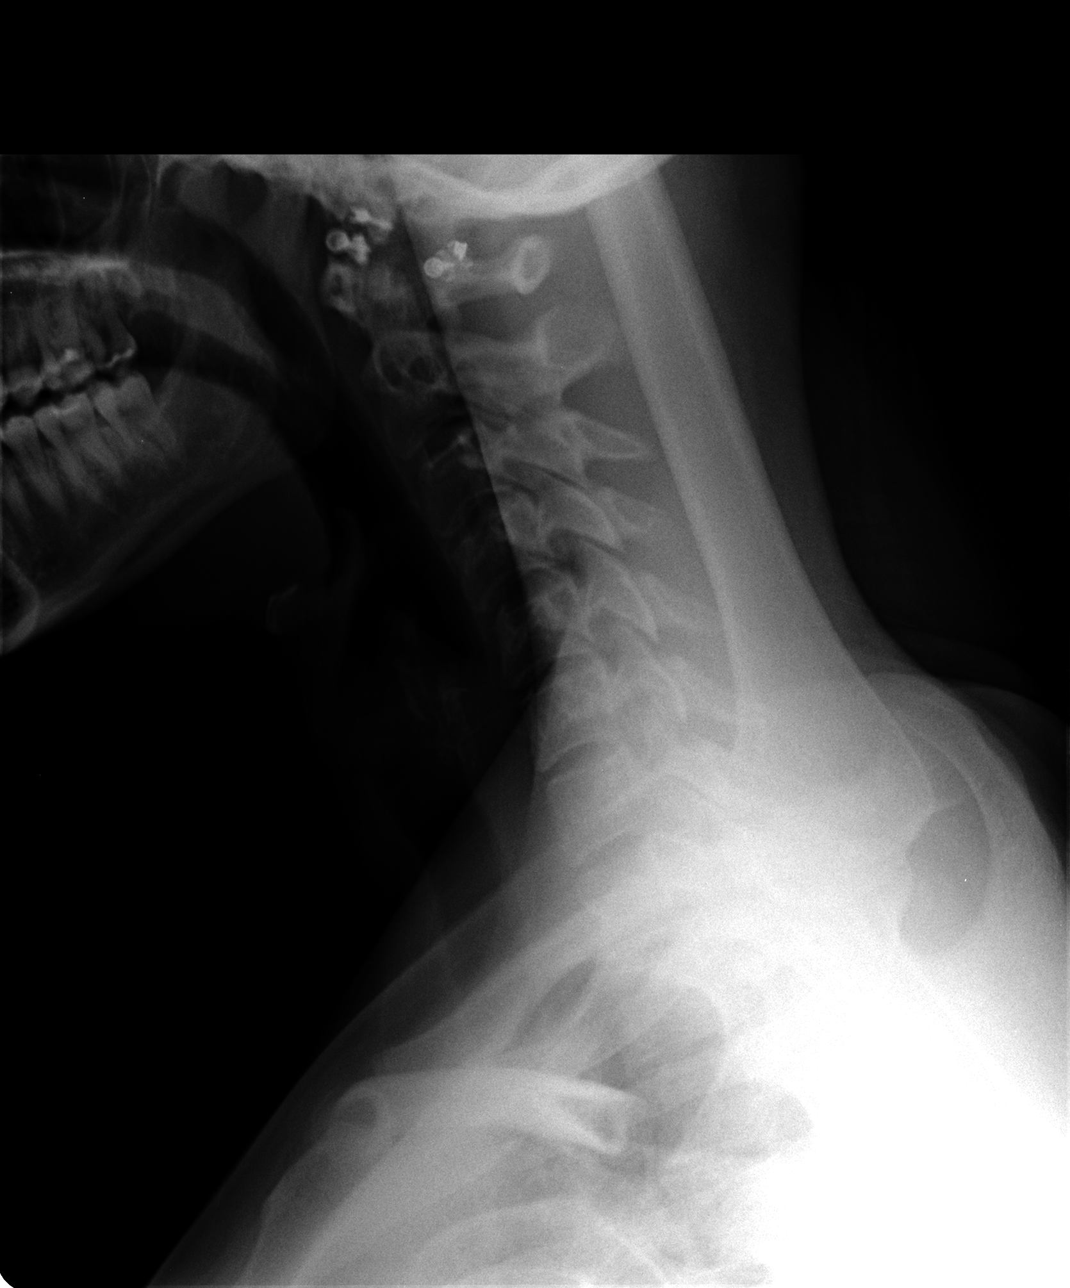

[3 of 3 positions shown; findings below may reference images not displayed]

FINDINGS: Between T1 and T11 there is 9 degrees of dextroconvex thoracic
scoliosis. No thoracic vertebral anomalies. Right upper quadrant
clips are likely from prior cholecystectomy.

No thoracic spine fracture or subluxation is observed.
IMPRESSION: 1. 9 degrees of dextroconvex thoracic scoliosis. Otherwise, no
significant abnormalities are observed.

## 2016-06-24 ENCOUNTER — Other Ambulatory Visit: Payer: Self-pay

## 2016-06-24 ENCOUNTER — Other Ambulatory Visit: Payer: Self-pay | Admitting: *Deleted

## 2016-06-24 MED ORDER — TRAMADOL HCL 50 MG PO TABS: ORAL_TABLET | ORAL | 0 refills | Status: DC

## 2016-07-01 ENCOUNTER — Encounter: Payer: Self-pay | Admitting: Sports Medicine

## 2016-07-01 ENCOUNTER — Ambulatory Visit (INDEPENDENT_AMBULATORY_CARE_PROVIDER_SITE_OTHER): Payer: Managed Care, Other (non HMO) | Admitting: Sports Medicine

## 2016-07-01 DIAGNOSIS — F419 Anxiety disorder, unspecified: Secondary | ICD-10-CM | POA: Diagnosis not present

## 2016-07-01 DIAGNOSIS — F4323 Adjustment disorder with mixed anxiety and depressed mood: Secondary | ICD-10-CM | POA: Diagnosis not present

## 2016-07-01 MED ORDER — TRAMADOL HCL 50 MG PO TABS: ORAL_TABLET | ORAL | 3 refills | Status: DC

## 2016-07-01 MED ORDER — ALPRAZOLAM 0.5 MG PO TABS: 0.5000 mg | ORAL_TABLET | Freq: Two times a day (BID) | ORAL | 2 refills | Status: DC | PRN

## 2016-07-01 NOTE — Assessment & Plan Note (Signed)
Doing extremely well off of all controller medications, only needs occasional alprazolam, I have not refilled this for 6 months.

## 2016-07-01 NOTE — Progress Notes (Signed)
  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  Her neck is somewhat stable, she needs a refill on tramadol.  Past medical history:  Negative.  See flowsheet/record as well for more information.  Surgical history: Negative.  See flowsheet/record as well for more information.  Family history: Negative.  See flowsheet/record as well for more information.  Social history: Negative.  See flowsheet/record as well for more information.  Allergies, and medications have been entered into the medical record, reviewed, and no changes needed.   Review of Systems: No fevers, chills, night sweats, weight loss, chest pain, or shortness of breath.   Objective:    General: Well Developed, well nourished, and in no acute distress.  Neuro: Alert and oriented x3, extra-ocular muscles intact, sensation grossly intact.  HEENT: Normocephalic, atraumatic, pupils equal round reactive to light, neck supple, no masses, no lymphadenopathy, thyroid nonpalpable.  Skin: Warm and dry, no rashes. Cardiac: Regular rate and rhythm, no murmurs rubs or gallops, no lower extremity edema.  Respiratory: Clear to auscultation bilaterally. Not using accessory muscles, speaking in full sentences.  Impression and Recommendations:    Anxiety Doing extremely well off of all controller medications, only needs occasional alprazolam, I have not refilled this for 6 months.

## 2016-07-08 ENCOUNTER — Ambulatory Visit (INDEPENDENT_AMBULATORY_CARE_PROVIDER_SITE_OTHER): Payer: Managed Care, Other (non HMO) | Admitting: Sports Medicine

## 2016-07-08 DIAGNOSIS — J111 Influenza due to unidentified influenza virus with other respiratory manifestations: Secondary | ICD-10-CM | POA: Insufficient documentation

## 2016-07-08 DIAGNOSIS — R69 Illness, unspecified: Secondary | ICD-10-CM

## 2016-07-08 MED ORDER — HYDROCOD POLST-CPM POLST ER 10-8 MG/5ML PO SUER: 5.0000 mL | Freq: Two times a day (BID) | ORAL | 0 refills | Status: DC | PRN

## 2016-07-08 MED ORDER — ONDANSETRON 8 MG PO TBDP: 8.0000 mg | ORAL_TABLET | Freq: Three times a day (TID) | ORAL | 3 refills | Status: DC | PRN

## 2016-07-08 MED ORDER — OSELTAMIVIR PHOSPHATE 75 MG PO CAPS: 75.0000 mg | ORAL_CAPSULE | Freq: Two times a day (BID) | ORAL | 0 refills | Status: DC

## 2016-07-08 NOTE — Progress Notes (Signed)
  Subjective:    CC: Feeling sick  HPI: For the past 2 days has had headaches, fevers, chills, muscle aches, body aches, cough, mild sore throat. Works as a Engineer, civil (consulting)nurse in the emergency department.  Past medical history:  Negative.  See flowsheet/record as well for more information.  Surgical history: Negative.  See flowsheet/record as well for more information.  Family history: Negative.  See flowsheet/record as well for more information.  Social history: Negative.  See flowsheet/record as well for more information.  Allergies, and medications have been entered into the medical record, reviewed, and no changes needed.   Review of Systems: No fevers, chills, night sweats, weight loss, chest pain, or shortness of breath.   Objective:    General: Well Developed, well nourished, and in no acute distress.  Neuro: Alert and oriented x3, extra-ocular muscles intact, sensation grossly intact.  HEENT: Normocephalic, atraumatic, pupils equal round reactive to light, neck supple, no masses, no lymphadenopathy, thyroid nonpalpable. Oropharynx, nasopharynx, ear canals unremarkable. Skin: Warm and dry, no rashes. Cardiac: Regular rate and rhythm, no murmurs rubs or gallops, no lower extremity edema.  Respiratory: Clear to auscultation bilaterally. Not using accessory muscles, speaking in full sentences.  Flu test is negative.  Impression and Recommendations:    Influenza-like illness Tamiflu, Tussionex, Zofran. She will call her childrens' pediatrician to get them treated as well. Return as needed.  I spent 25 minutes with this patient, greater than 50% was face-to-face time counseling regarding the above diagnoses

## 2016-07-08 NOTE — Assessment & Plan Note (Signed)
Tamiflu, Tussionex, Zofran. She will call her childrens' pediatrician to get them treated as well. Return as needed.

## 2016-11-29 ENCOUNTER — Telehealth: Payer: Self-pay

## 2016-11-29 MED ORDER — TRAMADOL HCL 50 MG PO TABS: ORAL_TABLET | ORAL | 3 refills | Status: DC

## 2016-11-29 NOTE — Telephone Encounter (Signed)
Pt is requesting a refill of tramadol.  Is this appropriate? -EH/RMA

## 2017-04-06 ENCOUNTER — Telehealth: Payer: Self-pay | Admitting: Sports Medicine

## 2017-04-06 MED ORDER — TRAMADOL HCL 50 MG PO TABS: ORAL_TABLET | ORAL | 3 refills | Status: DC

## 2017-04-06 NOTE — Telephone Encounter (Signed)
Ok to refill.  Signed and in box.

## 2017-04-06 NOTE — Telephone Encounter (Signed)
Pt advised.

## 2017-06-09 ENCOUNTER — Ambulatory Visit (INDEPENDENT_AMBULATORY_CARE_PROVIDER_SITE_OTHER): Payer: Managed Care, Other (non HMO) | Admitting: Osteopathic Medicine

## 2017-06-09 ENCOUNTER — Encounter: Payer: Self-pay | Admitting: Osteopathic Medicine

## 2017-06-09 DIAGNOSIS — R059 Cough, unspecified: Secondary | ICD-10-CM

## 2017-06-09 DIAGNOSIS — R05 Cough: Secondary | ICD-10-CM

## 2017-06-09 MED ORDER — ONDANSETRON 8 MG PO TBDP: 8.0000 mg | ORAL_TABLET | Freq: Three times a day (TID) | ORAL | 1 refills | Status: DC | PRN

## 2017-06-09 MED ORDER — PREDNISONE 20 MG PO TABS: 20.0000 mg | ORAL_TABLET | Freq: Two times a day (BID) | ORAL | 0 refills | Status: DC

## 2017-06-09 MED ORDER — HYDROCOD POLST-CPM POLST ER 10-8 MG/5ML PO SUER: 5.0000 mL | Freq: Two times a day (BID) | ORAL | 0 refills | Status: DC | PRN

## 2017-06-09 NOTE — Progress Notes (Signed)
HPI: April Nielsen is a 41 y.o. female who  has a past medical history of Abnormal Pap smear of cervix (2010), Bronchitis, GERD (gastroesophageal reflux disease), Herpes, and Stomach ulcer.  [facility], 06/09/17,  for chief complaint of:  Chief Complaint  Patient presents with  . Cough    Reports recent viral upper respiratory type illness starting last week, overall feeling a bit better but cough has persisted, reports occasional shortness of breath with severe bouts of coughing. In the past, she has been prescribed cough medicine and Zofran from PCP. Requests refill of this. Former smoker, no history of asthma.   Past medical, surgical, social and family history reviewed:  Patient Active Problem List   Diagnosis Date Noted  . Influenza-like illness 07/08/2016  . Peripartum cardiomyopathy 02/24/2015  . Anxiety 10/28/2014  . Dysmenorrhea 05/27/2014  . Abnormal uterine bleeding 05/13/2014  . Thoracic back pain 03/21/2014  . Preventive measure 11/13/2012  . Right C6 radiculopathy 03/02/2012    Past Surgical History:  Procedure Laterality Date  . c-sections     x3  . CESAREAN SECTION     x 3  . CHOLECYSTECTOMY    . TUBAL LIGATION    . WISDOM TOOTH EXTRACTION      Social History   Tobacco Use  . Smoking status: Former Smoker    Packs/day: 1.00    Years: 3.00    Pack years: 3.00    Types: Cigarettes  . Smokeless tobacco: Never Used  . Tobacco comment: tobacco use- no   Substance Use Topics  . Alcohol use: No    Alcohol/week: 0.0 oz    Family History  Problem Relation Age of Onset  . Hypertension Father   . Hyperlipidemia Father   . Hyperlipidemia Mother   . Cancer Maternal Grandmother        cervical  . Cancer Maternal Grandfather        Throat  . Diabetes Paternal Grandmother   . Heart attack Paternal Grandmother   . Cancer Paternal Grandfather        Leukemia     Current medication list and  allergy/intolerance information reviewed:    Current Outpatient Medications  Medication Sig Dispense Refill  . acetaminophen (TYLENOL) 500 MG tablet Take 500 mg by mouth.    . ondansetron (ZOFRAN-ODT) 8 MG disintegrating tablet Take 1 tablet (8 mg total) by mouth every 8 (eight) hours as needed for nausea. 20 tablet 3  . oseltamivir (TAMIFLU) 75 MG capsule Take 1 capsule (75 mg total) by mouth 2 (two) times daily. 10 capsule 0  . ranitidine (ZANTAC) 300 MG tablet Take 1 tablet (300 mg total) by mouth 2 (two) times daily. 180 tablet 3  . traMADol (ULTRAM) 50 MG tablet TAKE 2 TABLETS BY MOUTH EVERY 8 HOURS AS NEEDED 180 tablet 3  . valACYclovir (VALTREX) 1000 MG tablet Take 1 tablet (1,000 mg total) by mouth 2 (two) times daily. 60 tablet 11   No current facility-administered medications for this visit.     No Known Allergies    Review of Systems:  Constitutional:  No  fever, no chills, +recent illness, No unintentional weight changes. No significant fatigue.   HEENT: No  headache, no vision change, no hearing change, No sore throat, No  sinus pressure  Cardiac: No  chest pain, No  pressure, No palpitations  Respiratory:  +shortness of breath. +Cough  Gastrointestinal: No  abdominal pain, No  nausea, No  vomiting  Musculoskeletal: No new myalgia/arthralgia  Skin: No  Rash,  Neurologic: No  weakness  Exam:  BP (!) 148/101   Pulse 93   Temp 98.3 F (36.8 C) (Oral)   Wt 112 lb (50.8 kg)   BMI 19.22 kg/m   Constitutional: VS see above. General Appearance: alert, well-developed, well-nourished, NAD  Eyes: Normal lids and conjunctive, non-icteric sclera  Ears, Nose, Mouth, Throat: MMM, Normal external inspection ears/nares/mouth/lips/gums. TM normal bilaterally. Pharynx/tonsils no erythema, no exudate. Nasal mucosa normal.   Neck: No masses, trachea midline. No thyroid enlargement. No tenderness/mass appreciated. No lymphadenopathy  Respiratory: Normal respiratory effort.  no wheeze, no rhonchi, no rales  Cardiovascular: S1/S2 normal, no murmur, no rub/gallop auscultated. RRR. No lower extremity edema.   Musculoskeletal: Gait normal  Skin: warm, dry, intact.  Psychiatric: Normal judgment/insight. Normal mood and affect. Oriented x3.      ASSESSMENT/PLAN:   Cough - Plan: predniSONE (DELTASONE) 20 MG tablet, ondansetron (ZOFRAN-ODT) 8 MG disintegrating tablet, chlorpheniramine-HYDROcodone (TUSSIONEX) 10-8 MG/5ML SUER    Patient Instructions  Over-the-Counter Medications & Home Remedies for Upper Respiratory Illness  Note: the following list assumes no pregnancy, normal liver & kidney function and no other drug interactions. Dr. Lyn HollingsheadAlexander has highlighted medications which are safe for you to use, but these may not be appropriate for everyone. Always ask a pharmacist or qualified medical provider if you have any questions!   Aches/Pains, Fever, Headache Acetaminophen (Tylenol) 500 mg tablets - take max 2 tablets (1000 mg) every 6 hours (4 times per day)  Ibuprofen (Motrin) 200 mg tablets - take max 4 tablets (800 mg) every 6 hours*  Sinus Congestion Nasal Saline if desired Phenylephrine (Sudafed) 10 mg tablets every 4 hours (or the 12-hour formulation)* Diphenhydramine (Benadryl) 25 mg tablets - take max 2 tablets every 4 hours  Cough & Sore Throat Prescription cough pills or syrups as directed Dextromethorphan (Robitussin, others) - cough suppressant Guaifenesin (Robitussin, Mucinex, others) - expectorant (helps cough up mucus) (Dextromethorphan and Guaifenesin also come in a combination tablet) Lozenges w/ Benzocaine + Menthol (Cepacol) Honey - as much as you want! Teas which "coat the throat" - look for ingredients Elm Bark, Licorice Root, Marshmallow Root  Other Zinc Lozenges within 24 hours of symptoms onset - mixed evidence this shortens the duration of the common cold Don't waste your money on Vitamin C or Echinacea  *Caution in patients  with high blood pressure       Visit summary with medication list and pertinent instructions was printed for patient to review. All questions at time of visit were answered - patient instructed to contact office with any additional concerns. ER/RTC precautions were reviewed with the patient.   Follow-up plan: Return if symptoms worsen or fail to improve.  Note: Total time spent 25 minutes, greater than 50% of the visit was spent face-to-face counseling and coordinating care for the following: The encounter diagnosis was Cough..  Please note: voice recognition software was used to produce this document, and typos may escape review. Please contact Dr. Lyn HollingsheadAlexander for any needed clarifications.

## 2017-06-09 NOTE — Patient Instructions (Signed)
Over-the-Counter Medications & Home Remedies for Upper Respiratory Illness  Note: the following list assumes no pregnancy, normal liver & kidney function and no other drug interactions. Dr. Lyn HollingsheadAlexander has highlighted medications which are safe for you to use, but these may not be appropriate for everyone. Always ask a pharmacist or qualified medical provider if you have any questions!   Aches/Pains, Fever, Headache Acetaminophen (Tylenol) 500 mg tablets - take max 2 tablets (1000 mg) every 6 hours (4 times per day)  Ibuprofen (Motrin) 200 mg tablets - take max 4 tablets (800 mg) every 6 hours*  Sinus Congestion Nasal Saline if desired Phenylephrine (Sudafed) 10 mg tablets every 4 hours (or the 12-hour formulation)* Diphenhydramine (Benadryl) 25 mg tablets - take max 2 tablets every 4 hours  Cough & Sore Throat Prescription cough pills or syrups as directed Dextromethorphan (Robitussin, others) - cough suppressant Guaifenesin (Robitussin, Mucinex, others) - expectorant (helps cough up mucus) (Dextromethorphan and Guaifenesin also come in a combination tablet) Lozenges w/ Benzocaine + Menthol (Cepacol) Honey - as much as you want! Teas which "coat the throat" - look for ingredients Elm Bark, Licorice Root, Marshmallow Root  Other Zinc Lozenges within 24 hours of symptoms onset - mixed evidence this shortens the duration of the common cold Don't waste your money on Vitamin C or Echinacea  *Caution in patients with high blood pressure

## 2017-08-15 ENCOUNTER — Ambulatory Visit: Payer: Managed Care, Other (non HMO) | Admitting: Sports Medicine

## 2017-08-15 DIAGNOSIS — M26602 Left temporomandibular joint disorder, unspecified: Secondary | ICD-10-CM | POA: Insufficient documentation

## 2017-08-15 MED ORDER — PREDNISONE 50 MG PO TABS: ORAL_TABLET | ORAL | 0 refills | Status: DC

## 2017-08-15 MED ORDER — TRAMADOL HCL 50 MG PO TABS: ORAL_TABLET | ORAL | 3 refills | Status: DC

## 2017-08-15 NOTE — Assessment & Plan Note (Signed)
With popping, catching. I have recommended that she keep follow-up with her dentist for a custom molded mouthguard, and ultimately the dentist would have to refer her to oral surgery if injections or operative intervention are needed. Adding a burst of prednisone to calm things down in the meantime.

## 2017-08-15 NOTE — Patient Instructions (Signed)
Temporomandibular Joint Syndrome Temporomandibular joint (TMJ) syndrome is a condition that affects the joints between your jaw and your skull. The TMJs are located near your ears and allow your jaw to open and close. These joints and the nearby muscles are involved in all movements of the jaw. People with TMJ syndrome have pain in the area of these joints and muscles. Chewing, biting, or other movements of the jaw can be difficult or painful. TMJ syndrome can be caused by various things. In many cases, the condition is mild and goes away within a few weeks. For some people, the condition can become a long-term problem. What are the causes? Possible causes of TMJ syndrome include:  Grinding your teeth or clenching your jaw. Some people do this when they are under stress.  Arthritis.  Injury to the jaw.  Head or neck injury.  Teeth or dentures that are not aligned well.  In some cases, the cause of TMJ syndrome may not be known. What are the signs or symptoms? The most common symptom is an aching pain on the side of the head in the area of the TMJ. Other symptoms may include:  Pain when moving your jaw, such as when chewing or biting.  Being unable to open your jaw all the way.  Making a clicking sound when you open your mouth.  Headache.  Earache.  Neck or shoulder pain.  How is this diagnosed? Diagnosis can usually be made based on your symptoms, your medical history, and a physical exam. Your health care provider may check the range of motion of your jaw. Imaging tests, such as X-rays or an MRI, are sometimes done. You may need to see your dentist to determine if your teeth and jaw are lined up correctly. How is this treated? TMJ syndrome often goes away on its own. If treatment is needed, the options may include:  Eating soft foods and applying ice or heat.  Medicines to relieve pain or inflammation.  Medicines to relax the muscles.  A splint, bite plate, or mouthpiece  to prevent teeth grinding or jaw clenching.  Relaxation techniques or counseling to help reduce stress.  Transcutaneous electrical nerve stimulation (TENS). This helps to relieve pain by applying an electrical current through the skin.  Acupuncture. This is sometimes helpful to relieve pain.  Jaw surgery. This is rarely needed.  Follow these instructions at home:  Take medicines only as directed by your health care provider.  Eat a soft diet if you are having trouble chewing.  Apply ice to the painful area. ? Put ice in a plastic bag. ? Place a towel between your skin and the bag. ? Leave the ice on for 20 minutes, 2-3 times a day.  Apply a warm compress to the painful area as directed.  Massage your jaw area and perform any jaw stretching exercises as recommended by your health care provider.  If you were given a mouthpiece or bite plate, wear it as directed.  Avoid foods that require a lot of chewing. Do not chew gum.  Keep all follow-up visits as directed by your health care provider. This is important. Contact a health care provider if:  You are having trouble eating.  You have new or worsening symptoms. Get help right away if:  Your jaw locks open or closed. This information is not intended to replace advice given to you by your health care provider. Make sure you discuss any questions you have with your health care provider. Document   Released: 03/01/2001 Document Revised: 02/04/2016 Document Reviewed: 01/09/2014 [employer]vier Interactive Patient Education  2018 [employer]  
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
## 2017-12-05 ENCOUNTER — Other Ambulatory Visit: Payer: Self-pay | Admitting: Sports Medicine

## 2017-12-06 ENCOUNTER — Other Ambulatory Visit: Payer: Self-pay | Admitting: Sports Medicine

## 2017-12-06 MED ORDER — VALACYCLOVIR HCL 1 G PO TABS: 1000.0000 mg | ORAL_TABLET | Freq: Two times a day (BID) | ORAL | 11 refills | Status: DC

## 2018-03-30 ENCOUNTER — Telehealth: Payer: Self-pay

## 2018-03-30 MED ORDER — TRAMADOL HCL 50 MG PO TABS: 50.0000 mg | ORAL_TABLET | Freq: Two times a day (BID) | ORAL | 3 refills | Status: DC

## 2018-03-30 NOTE — Telephone Encounter (Signed)
PT needs a refill on her Ultram.   Pharmacy on file is correct.

## 2018-03-30 NOTE — Telephone Encounter (Signed)
Done

## 2018-03-30 NOTE — Telephone Encounter (Signed)
Pt notified med sent to pharmacy. KG LPN 

## 2018-04-12 ENCOUNTER — Telehealth: Payer: Self-pay

## 2018-04-12 MED ORDER — TRAMADOL HCL 50 MG PO TABS: 100.0000 mg | ORAL_TABLET | Freq: Two times a day (BID) | ORAL | 3 refills | Status: DC

## 2018-04-12 NOTE — Telephone Encounter (Signed)
PT came in the office today concerned that she is out of medication. The script for Tramadol was sent in for 1 tablet 2x daily. PT states it's supposed to be 2 tablets 2x daily. She is in a lot of pain and needs this medication to start her new job. She is currently out of the medication because she was taking it as previously prescribed. Pharmacy on file is correct.

## 2018-04-12 NOTE — Addendum Note (Signed)
Addended by: Monica Becton on: 04/12/2018 10:12 AM   Modules accepted: Orders

## 2018-04-12 NOTE — Telephone Encounter (Signed)
Done.  24-48h turn around time for rx refill requests.

## 2018-04-12 NOTE — Telephone Encounter (Signed)
PT called back at 10:01am asking if the medication was filled yet.

## 2018-05-15 ENCOUNTER — Telehealth: Payer: Self-pay | Admitting: *Deleted

## 2018-05-15 MED ORDER — TRAMADOL HCL 50 MG PO TABS: 100.0000 mg | ORAL_TABLET | Freq: Three times a day (TID) | ORAL | 3 refills | Status: DC

## 2018-05-15 NOTE — Telephone Encounter (Signed)
Pt left vm requesting that her Tramadol be increased to 2 tabs TID.  Pharmacy on file is correct.

## 2018-05-15 NOTE — Telephone Encounter (Signed)
She is exhibiting some tolerance to the medication but because we have room to go up I am going to increase it for now.

## 2018-05-15 NOTE — Telephone Encounter (Signed)
Patient was notified and voices understanding. She did not have any further questions.

## 2018-09-06 ENCOUNTER — Other Ambulatory Visit: Payer: Self-pay

## 2018-09-06 ENCOUNTER — Ambulatory Visit (INDEPENDENT_AMBULATORY_CARE_PROVIDER_SITE_OTHER): Payer: Managed Care, Other (non HMO) | Admitting: Sports Medicine

## 2018-09-06 ENCOUNTER — Encounter: Payer: Self-pay | Admitting: Sports Medicine

## 2018-09-06 DIAGNOSIS — M2550 Pain in unspecified joint: Secondary | ICD-10-CM

## 2018-09-06 DIAGNOSIS — G43909 Migraine, unspecified, not intractable, without status migrainosus: Secondary | ICD-10-CM | POA: Insufficient documentation

## 2018-09-06 DIAGNOSIS — R51 Headache: Secondary | ICD-10-CM

## 2018-09-06 DIAGNOSIS — R030 Elevated blood-pressure reading, without diagnosis of hypertension: Secondary | ICD-10-CM | POA: Insufficient documentation

## 2018-09-06 DIAGNOSIS — R519 Headache, unspecified: Secondary | ICD-10-CM

## 2018-09-06 DIAGNOSIS — Z299 Encounter for prophylactic measures, unspecified: Secondary | ICD-10-CM

## 2018-09-06 DIAGNOSIS — M542 Cervicalgia: Secondary | ICD-10-CM

## 2018-09-06 DIAGNOSIS — Z0000 Encounter for general adult medical examination without abnormal findings: Secondary | ICD-10-CM

## 2018-09-06 MED ORDER — TRAMADOL HCL 50 MG PO TABS: 100.0000 mg | ORAL_TABLET | Freq: Three times a day (TID) | ORAL | 3 refills | Status: DC

## 2018-09-06 MED ORDER — RIZATRIPTAN BENZOATE 5 MG PO TBDP: 5.0000 mg | ORAL_TABLET | ORAL | 0 refills | Status: DC | PRN

## 2018-09-06 NOTE — Assessment & Plan Note (Signed)
There is an element of photophobia, nausea, photophobia with the headaches. This raises the suspicion of a migrainous component. Adding low-dose Maxalt to use as needed. We did discuss adjusting to bluelight filter on her computer at work to help eyestrain.

## 2018-09-06 NOTE — Assessment & Plan Note (Signed)
CPE and fasting labs.

## 2018-09-06 NOTE — Assessment & Plan Note (Signed)
Polyarthralgia, PIP swelling and pain. Multiple aches and pains, doing a full rheumatoid work-up. We will also do an anti-inflammatory diet, we discussed the arachidonic acid cascade and the need to increase omega-3 omega 6 fatty acids.

## 2018-09-06 NOTE — Assessment & Plan Note (Signed)
Symptoms are relatively stable with tramadol, refilling this. I can refill this yearly. We do need an updated x-ray and MRI.

## 2018-09-06 NOTE — Assessment & Plan Note (Signed)
Likely a whitecoat component, improved on recheck.

## 2018-09-06 NOTE — Progress Notes (Signed)
Subjective:    CC: Multiple issues  HPI: Cervical DDD: Historically well controlled with tramadol.  She does need to follow-up for a refill.  She is having a slight worsening of pain, and is potentially interested in interventional planning.  No bowel or bladder dysfunction, no progressive weakness, no trauma, no constitutional symptoms.  Headaches: Right-sided posterior head, occasionally with photophobia and phonophobia and nausea.  They are also precipitated with constant staring at her computer screen.  Polyarthralgia: Localized over the body but also in the PIPs.  Has never been tested for rheumatoid arthritis.  I reviewed the past medical history, family history, social history, surgical history, and allergies today and no changes were needed.  Please see the problem list section below in epic for further details.  Past Medical History: Past Medical History:  Diagnosis Date  . Abnormal Pap smear of cervix 2010  . Bronchitis   . GERD (gastroesophageal reflux disease)   . Herpes   . Stomach ulcer    Past Surgical History: Past Surgical History:  Procedure Laterality Date  . c-sections     x3  . CESAREAN SECTION     x 3  . CHOLECYSTECTOMY    . TUBAL LIGATION    . WISDOM TOOTH EXTRACTION     Social History: Social History   Socioeconomic History  . Marital status: Single    Spouse name: Not on file  . Number of children: Not on file  . Years of education: Not on file  . Highest education level: Not on file  Occupational History  . Occupation: Charity fundraiser  Social Needs  . Financial resource strain: Not on file  . Food insecurity:    Worry: Not on file    Inability: Not on file  . Transportation needs:    Medical: Not on file    Non-medical: Not on file  Tobacco Use  . Smoking status: Former Smoker    Packs/day: 1.00    Years: 3.00    Pack years: 3.00    Types: Cigarettes    Last attempt to quit: 06/20/2009    Years since quitting: 9.2  . Smokeless tobacco: Never  Used  . Tobacco comment: tobacco use- no   Substance and Sexual Activity  . Alcohol use: No    Alcohol/week: 0.0 standard drinks  . Drug use: No  . Sexual activity: Yes    Partners: Male    Birth control/protection: Surgical  Lifestyle  . Physical activity:    Days per week: Not on file    Minutes per session: Not on file  . Stress: Not on file  Relationships  . Social connections:    Talks on phone: Not on file    Gets together: Not on file    Attends religious service: Not on file    Active member of club or organization: Not on file    Attends meetings of clubs or organizations: Not on file    Relationship status: Not on file  Other Topics Concern  . Not on file  Social History Narrative  . Not on file   Family History: Family History  Problem Relation Age of Onset  . Hypertension Father   . Hyperlipidemia Father   . Hyperlipidemia Mother   . Cancer Maternal Grandmother        cervical  . Cancer Maternal Grandfather        Throat  . Diabetes Paternal Grandmother   . Heart attack Paternal Grandmother   . Cancer Paternal Grandfather  Leukemia   Allergies: No Known Allergies Medications: See med rec.  Review of Systems: No fevers, chills, night sweats, weight loss, chest pain, or shortness of breath.   Objective:    General: Well Developed, well nourished, and in no acute distress.  Neuro: Alert and oriented x3, extra-ocular muscles intact, sensation grossly intact. Cranial nerves II through XII are intact, motor, sensory, and coordinative functions are all intact. HEENT: Normocephalic, atraumatic, pupils equal round reactive to light, neck supple, no masses, no lymphadenopathy, thyroid nonpalpable. Oropharynx, nasopharynx, external ear canals are unremarkable. Skin: Warm and dry, no rashes noted.  Cardiac: Regular rate and rhythm, no murmurs rubs or gallops.  Respiratory: Clear to auscultation bilaterally. Not using accessory muscles, speaking in full  sentences.  Abdominal: Soft, nontender, nondistended, positive bowel sounds, no masses, no organomegaly.  Musculoskeletal: Shoulder, elbow, wrist, hip, knee, ankle stable, and with full range of motion.  Impression and Recommendations:    Right C6 radiculopathy Symptoms are relatively stable with tramadol, refilling this. I can refill this yearly. We do need an updated x-ray and MRI.   Polyarthralgia Polyarthralgia, PIP swelling and pain. Multiple aches and pains, doing a full rheumatoid work-up. We will also do an anti-inflammatory diet, we discussed the arachidonic acid cascade and the need to increase omega-3 omega 6 fatty acids.  Headache There is an element of photophobia, nausea, photophobia with the headaches. This raises the suspicion of a migrainous component. Adding low-dose Maxalt to use as needed. We did discuss adjusting to bluelight filter on her computer at work to help eyestrain.  Annual physical exam CPE and fasting labs.  Elevated blood-pressure reading without diagnosis of hypertension Likely a whitecoat component, improved on recheck.   ___________________________________________ Ihor Austin. Benjamin Stain, M.D., ABFM., CAQSM. Primary Care and Sports Medicine [facility]  Adjunct Professor of Family Medicine  [employer]

## 2018-10-09 ENCOUNTER — Other Ambulatory Visit: Payer: Self-pay | Admitting: Sports Medicine

## 2018-10-09 MED ORDER — VITAMIN D (ERGOCALCIFEROL) 1.25 MG (50000 UNIT) PO CAPS: 50000.0000 [IU] | ORAL_CAPSULE | ORAL | 0 refills | Status: DC

## 2018-10-13 LAB — COMPREHENSIVE METABOLIC PANEL
AG Ratio: 2.1 (calc) (ref 1.0–2.5)
ALT: 6 U/L (ref 6–29)
AST: 11 U/L (ref 10–30)
BUN: 17 mg/dL (ref 7–25)
CO2: 26 mmol/L (ref 20–32)
Calcium: 9.1 mg/dL (ref 8.6–10.2)
Chloride: 107 mmol/L (ref 98–110)
Creat: 0.54 mg/dL (ref 0.50–1.10)
Globulin: 2.1 g/dL (calc) (ref 1.9–3.7)
Glucose, Bld: 86 mg/dL (ref 65–99)
Sodium: 140 mmol/L (ref 135–146)
Total Bilirubin: 0.3 mg/dL (ref 0.2–1.2)
Total Protein: 6.6 g/dL (ref 6.1–8.1)

## 2018-10-13 LAB — RHEUMATOID ARTHRITIS DIAGNOSTIC PANEL, COMPREHENSIVE
Cyclic Citrullin Peptide Ab: <16 Units (ref <20)
Rheumatoid Factor (IgA): <5 U (ref <=6)
Rheumatoid Factor (IgG): <5 U (ref <=6)
Rheumatoid Factor (IgM): <5 U (ref <=6)
SSA (Ro) (ENA) Antibody, IgG: <1 AI
SSB (La) (ENA) Antibody, IgG: <1 AI

## 2018-10-13 LAB — HEMOGLOBIN A1C
Hgb A1c MFr Bld: 4.8 % of total Hgb (ref <5.7)
Mean Plasma Glucose: 91 (calc)
eAG (mmol/L): 5 (calc)

## 2018-10-13 LAB — CBC
HCT: 36 % (ref 35.0–45.0)
Hemoglobin: 12.4 g/dL (ref 11.7–15.5)
MCH: 30.5 pg (ref 27.0–33.0)
MCHC: 34.4 g/dL (ref 32.0–36.0)
MCV: 88.5 fL (ref 80.0–100.0)
MPV: 10.5 fL (ref 7.5–12.5)
Platelets: 219 10*3/uL (ref 140–400)
RBC: 4.07 10*6/uL (ref 3.80–5.10)
RDW: 12.9 % (ref 11.0–15.0)
WBC: 5.7 10*3/uL (ref 3.8–10.8)

## 2018-10-13 LAB — SEDIMENTATION RATE: Sed Rate: 2 mm/h (ref 0–20)

## 2018-10-13 LAB — LIPID PANEL W/REFLEX DIRECT LDL
Cholesterol: 190 mg/dL (ref <200)
HDL: 64 mg/dL (ref >=50)
LDL Cholesterol (Calc): 114 mg/dL (calc) — ABNORMAL HIGH
Non-HDL Cholesterol (Calc): 126 mg/dL (calc) (ref <130)
Total CHOL/HDL Ratio: 3 (calc) (ref <5.0)
Triglycerides: 41 mg/dL (ref <150)

## 2018-10-13 LAB — ANA, IFA COMPREHENSIVE PANEL
Anti Nuclear Antibody (ANA): NEGATIVE
ENA SM Ab Ser-aCnc: <1 AI
SM/RNP: <1 AI
SSA (Ro) (ENA) Antibody, IgG: <1 AI
SSB (La) (ENA) Antibody, IgG: <1 AI
Scleroderma (Scl-70) (ENA) Antibody, IgG: <1 AI
ds DNA Ab: <1 [IU]/mL

## 2018-10-13 LAB — URIC ACID: Uric Acid, Serum: 2.3 mg/dL — ABNORMAL LOW (ref 2.5–7.0)

## 2018-10-13 LAB — COMPREHENSIVE METABOLIC PANEL WITH GFR
Albumin: 4.5 g/dL (ref 3.6–5.1)
Alkaline phosphatase (APISO): 23 U/L — ABNORMAL LOW (ref 31–125)
Potassium: 4 mmol/L (ref 3.5–5.3)

## 2018-10-13 LAB — VITAMIN D 25 HYDROXY (VIT D DEFICIENCY, FRACTURES): Vit D, 25-Hydroxy: 11 ng/mL — ABNORMAL LOW (ref 30–100)

## 2018-10-13 LAB — TSH: TSH: 2.07 mIU/L

## 2018-10-13 LAB — CK: Total CK: 43 U/L (ref 29–143)

## 2018-10-13 LAB — HLA-B27 ANTIGEN: HLA-B27 Antigen: NEGATIVE

## 2018-12-02 ENCOUNTER — Ambulatory Visit (INDEPENDENT_AMBULATORY_CARE_PROVIDER_SITE_OTHER): Payer: PRIVATE HEALTH INSURANCE

## 2018-12-02 ENCOUNTER — Other Ambulatory Visit: Payer: Self-pay

## 2018-12-02 DIAGNOSIS — M542 Cervicalgia: Secondary | ICD-10-CM | POA: Diagnosis not present

## 2018-12-27 ENCOUNTER — Other Ambulatory Visit: Payer: Self-pay

## 2018-12-27 NOTE — Telephone Encounter (Signed)
Requesting RF on Tramadol   2 tab po TID  Last written 09/06/18 for #180 with 3 RF  RX pended

## 2018-12-28 MED ORDER — TRAMADOL HCL 50 MG PO TABS: 100.0000 mg | ORAL_TABLET | Freq: Three times a day (TID) | ORAL | 3 refills | Status: DC

## 2019-01-01 ENCOUNTER — Other Ambulatory Visit: Payer: Self-pay

## 2019-01-01 ENCOUNTER — Encounter: Payer: Self-pay | Admitting: Sports Medicine

## 2019-01-01 ENCOUNTER — Ambulatory Visit (INDEPENDENT_AMBULATORY_CARE_PROVIDER_SITE_OTHER): Payer: PRIVATE HEALTH INSURANCE | Admitting: Sports Medicine

## 2019-01-01 DIAGNOSIS — R51 Headache: Secondary | ICD-10-CM | POA: Diagnosis not present

## 2019-01-01 DIAGNOSIS — M542 Cervicalgia: Secondary | ICD-10-CM | POA: Diagnosis not present

## 2019-01-01 DIAGNOSIS — R519 Headache, unspecified: Secondary | ICD-10-CM

## 2019-01-01 MED ORDER — GABAPENTIN 300 MG PO CAPS: ORAL_CAPSULE | ORAL | 3 refills | Status: DC

## 2019-01-01 MED ORDER — RIZATRIPTAN BENZOATE 5 MG PO TBDP: 5.0000 mg | ORAL_TABLET | ORAL | 0 refills | Status: DC | PRN

## 2019-01-01 MED ORDER — ONDANSETRON 8 MG PO TBDP: 8.0000 mg | ORAL_TABLET | Freq: Three times a day (TID) | ORAL | 3 refills | Status: DC | PRN

## 2019-01-01 NOTE — Progress Notes (Signed)
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  We have been treating her for some time now for cervical DDD with cervical radiculitis, overall she does well with occasional tramadol but she is noting an increase in paresthesias going down both arms.  Symptoms are moderate, persistent, radiating down both arms, no progressive weakness, trauma, no constitutional symptoms.  In addition she has migraine headaches, these are fairly rare and well-controlled with an occasional Maxalt and Zofran.  I reviewed the past medical history, family history, social history, surgical history, and allergies today and no changes were needed.  Please see the problem list section below in epic for further details.  Past Medical History: Past Medical History:  Diagnosis Date  . Abnormal Pap smear of cervix 2010  . Bronchitis   . GERD (gastroesophageal reflux disease)   . Herpes   . Stomach ulcer    Past Surgical History: Past Surgical History:  Procedure Laterality Date  . c-sections     x3  . CESAREAN SECTION     x 3  . CHOLECYSTECTOMY    . TUBAL LIGATION    . WISDOM TOOTH EXTRACTION     Social History: Social History   Socioeconomic History  . Marital status: Single    Spouse name: Not on file  . Number of children: Not on file  . Years of education: Not on file  . Highest education level: Not on file  Occupational History  . Occupation: Therapist, sports  Social Needs  . Financial resource strain: Not on file  . Food insecurity    Worry: Not on file    Inability: Not on file  . Transportation needs    Medical: Not on file    Non-medical: Not on file  Tobacco Use  . Smoking status: Former Smoker    Packs/day: 1.00    Years: 3.00    Pack years: 3.00    Types: Cigarettes    Quit date: 06/20/2009    Years since quitting: 9.5  . Smokeless tobacco: Never Used   . Tobacco comment: tobacco use- no   Substance and Sexual Activity  . Alcohol use: No    Alcohol/week: 0.0 standard drinks  . Drug use: No  . Sexual activity: Yes    Partners: Male    Birth control/protection: Surgical  Lifestyle  . Physical activity    Days per week: Not on file    Minutes per session: Not on file  . Stress: Not on file  Relationships  . Social Herbalist on phone: Not on file    Gets together: Not on file    Attends religious service: Not on file    Active member of club or organization: Not on file    Attends meetings of clubs or organizations: Not on file    Relationship status: Not on file  Other Topics Concern  . Not on file  Social History Narrative  . Not on file   Family History: Family History  Problem Relation Age of Onset  . Hypertension Father   . Hyperlipidemia Father   . Hyperlipidemia Mother   . Cancer Maternal Grandmother        cervical  . Cancer Maternal Grandfather        Throat  . Diabetes Paternal Grandmother   . Heart attack Paternal Grandmother   .  Cancer Paternal Grandfather        Leukemia   Allergies: No Known Allergies Medications: See med rec.  Review of Systems: No fevers, chills, night sweats, weight loss, chest pain, or shortness of breath.   Objective:    General: Well Developed, well nourished, and in no acute distress.  Neuro: Alert and oriented x3, extra-ocular muscles intact, sensation grossly intact.  HEENT: Normocephalic, atraumatic, pupils equal round reactive to light, neck supple, no masses, no lymphadenopathy, thyroid nonpalpable.  Skin: Warm and dry, no rashes. Cardiac: Regular rate and rhythm, no murmurs rubs or gallops, no lower extremity edema.  Respiratory: Clear to auscultation bilaterally. Not using accessory muscles, speaking in full sentences. Neck: Negative spurling's Full neck range of motion Grip strength and sensation normal in bilateral hands Strength good C4 to T1  distribution No sensory change to C4 to T1 Reflexes normal  MRI personally reviewed, there is a slight progression of the C4-C5 disc protrusion with slight indentation of the anterior thecal sac but no overt spinal stenosis.  Impression and Recommendations:    Right C6 radiculopathy Slight progression of C4-C5 DDD with bilateral paresthesias, no progressive weakness. Continue current medications, we are going to add gabapentin in a steady up taper. Certainly if all of this fails we could try a cervical epidural.  Headache Good improvement with Maxalt, refilling this and doing a bit of Zofran as well. Headaches are not frequent enough yet to consider a migraine preventative agent such as Topamax or beta-blocker.   ___________________________________________ Ihor Austinhomas J. Benjamin Stainhekkekandam, M.D., ABFM., CAQSM. Primary Care and Sports Medicine [facility]  Adjunct Professor of Family Medicine  [employer]

## 2019-01-01 NOTE — Assessment & Plan Note (Signed)
Good improvement with Maxalt, refilling this and doing a bit of Zofran as well. Headaches are not frequent enough yet to consider a migraine preventative agent such as Topamax or beta-blocker.

## 2019-01-01 NOTE — Assessment & Plan Note (Signed)
Slight progression of C4-C5 DDD with bilateral paresthesias, no progressive weakness. Continue current medications, we are going to add gabapentin in a steady up taper. Certainly if all of this fails we could try a cervical epidural.

## 2019-02-13 ENCOUNTER — Other Ambulatory Visit: Payer: Self-pay | Admitting: Sports Medicine

## 2019-02-13 DIAGNOSIS — R519 Headache, unspecified: Secondary | ICD-10-CM

## 2019-02-26 ENCOUNTER — Encounter: Payer: Self-pay | Admitting: Emergency Medicine

## 2019-02-26 ENCOUNTER — Emergency Department
Admission: EM | Admit: 2019-02-26 | Discharge: 2019-02-26 | Disposition: A | Discharge status: Home / Self Care | Payer: PRIVATE HEALTH INSURANCE | Source: Home / Self Care | Attending: Emergency Medicine | Admitting: Emergency Medicine

## 2019-02-26 ENCOUNTER — Telehealth: Payer: Self-pay

## 2019-02-26 ENCOUNTER — Other Ambulatory Visit: Payer: Self-pay

## 2019-02-26 DIAGNOSIS — R824 Acetonuria: Secondary | ICD-10-CM | POA: Diagnosis not present

## 2019-02-26 DIAGNOSIS — R197 Diarrhea, unspecified: Secondary | ICD-10-CM

## 2019-02-26 DIAGNOSIS — E876 Hypokalemia: Secondary | ICD-10-CM | POA: Diagnosis not present

## 2019-02-26 DIAGNOSIS — R110 Nausea: Secondary | ICD-10-CM

## 2019-02-26 DIAGNOSIS — E860 Dehydration: Secondary | ICD-10-CM

## 2019-02-26 DIAGNOSIS — R634 Abnormal weight loss: Secondary | ICD-10-CM

## 2019-02-26 LAB — POCT URINALYSIS DIP (MANUAL ENTRY)
Bilirubin, UA: NEGATIVE
Glucose, UA: NEGATIVE mg/dL
Leukocytes, UA: NEGATIVE
Nitrite, UA: NEGATIVE
Protein Ur, POC: NEGATIVE mg/dL
Spec Grav, UA: 1.025 (ref 1.010–1.025)
Urobilinogen, UA: 0.2 E.U./dL
pH, UA: 6 (ref 5.0–8.0)

## 2019-02-26 LAB — COMPLETE METABOLIC PANEL WITH GFR
AG Ratio: 2.2 (calc) (ref 1.0–2.5)
ALT: 9 U/L (ref 6–29)
AST: 14 U/L (ref 10–30)
Albumin: 5.1 g/dL (ref 3.6–5.1)
Alkaline phosphatase (APISO): 25 U/L — ABNORMAL LOW (ref 31–125)
BUN: 9 mg/dL (ref 7–25)
CO2: 24 mmol/L (ref 20–32)
Calcium: 9.6 mg/dL (ref 8.6–10.2)
Chloride: 103 mmol/L (ref 98–110)
Creat: 0.59 mg/dL (ref 0.50–1.10)
GFR, Est African American: 131 mL/min/{1.73_m2} (ref >=60)
GFR, Est Non African American: 113 mL/min/{1.73_m2} (ref >=60)
Globulin: 2.3 g/dL (calc) (ref 1.9–3.7)
Glucose, Bld: 76 mg/dL (ref 65–99)
Potassium: 3.2 mmol/L — ABNORMAL LOW (ref 3.5–5.3)
Sodium: 139 mmol/L (ref 135–146)
Total Bilirubin: 0.6 mg/dL (ref 0.2–1.2)
Total Protein: 7.4 g/dL (ref 6.1–8.1)

## 2019-02-26 LAB — POCT CBC W AUTO DIFF (K'VILLE URGENT CARE)

## 2019-02-26 LAB — LIPASE: Lipase: 8 U/L (ref 7–60)

## 2019-02-26 NOTE — Telephone Encounter (Signed)
Left VM with recommendation  

## 2019-02-26 NOTE — Telephone Encounter (Signed)
Why do not we do a virtual visit first, and determine if she needs an additional test, it is entirely possible to have a negative initial test and a positive follow-up test.  Please make sure she can have some vital signs including temperature for me.  I have plenty of openings for a virtual visit today.

## 2019-02-26 NOTE — ED Triage Notes (Signed)
Nausea, vomiting, diarrhea, chills, no appetite, food tastes bad, fatigue x 1 week. Had negative COVID test yesterday

## 2019-02-26 NOTE — Telephone Encounter (Signed)
I called patient and informed her her potassium was low at 3.2 I sent in potassium 10 mEq to take twice a day for 10 days.  She is going to follow-up with Dr. Lewanda Rife

## 2019-02-26 NOTE — ED Provider Notes (Signed)
[facility]    CSN: [id number] Arrival date & time: 02/26/19  6803      History   Chief Complaint Chief Complaint  Patient presents with  . Nausea    HPI April Nielsen is a 43 y.o. female.  Patient presents with onset 1 week ago of intermittent episodes of nausea, fatigue, abdominal cramps, bad taste in her mouth, and 2-3 loose stools per day.  She denies any recent travel.  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  She does not know of any exposure to C. difficile.  She did have a COVID test done 2 days ago which was negative. HPI  Past Medical History:  Diagnosis Date  . Abnormal Pap smear of cervix 2010  . Bronchitis   . GERD (gastroesophageal reflux disease)   . Herpes   . Stomach ulcer     Patient Active Problem List   Diagnosis Date Noted  . Polyarthralgia 09/06/2018  . Headache 09/06/2018  . Elevated blood-pressure reading without diagnosis of hypertension 09/06/2018  . Disorder of left temporomandibular joint 08/15/2017  . Peripartum cardiomyopathy 02/24/2015  . Anxiety 10/28/2014  . Dysmenorrhea 05/27/2014  . Abnormal uterine bleeding 05/13/2014  . Thoracic back pain 03/21/2014  . Annual physical exam 11/13/2012  . Right C6 radiculopathy 03/02/2012    Past Surgical History:  Procedure Laterality Date  . c-sections     x3  . CESAREAN SECTION     x 3  . CHOLECYSTECTOMY    . TUBAL LIGATION    . WISDOM TOOTH EXTRACTION      OB History    Gravida  3   Para  3   Term  3   Preterm      AB      Living        SAB      TAB      Ectopic      Multiple      Live Births               Home Medications    Prior to Admission medications   Medication Sig Start Date End Date Taking? Authorizing Provider  acetaminophen (TYLENOL) 500 MG tablet Take 500 mg by mouth.    [provider]  ondansetron (ZOFRAN-ODT) 8 MG  disintegrating tablet Take 1 tablet (8 mg total) by mouth every 8 (eight) hours as needed for nausea. 01/01/19   Silverio Decamp, MD  rizatriptan (MAXALT-MLT) 5 MG disintegrating tablet TAKE ONE TABLET BY MOUTH AT ONSET OF HEADACHE; MAY REPEAT ONE TABLET IN 2 HOURS IF NEEDED. 02/13/19   Silverio Decamp, MD  traMADol (ULTRAM) 50 MG tablet Take 2 tablets (100 mg total) by mouth 3 (three) times daily. 12/28/18   Silverio Decamp, MD  valACYclovir (VALTREX) 1000 MG tablet Take 1 tablet (1,000 mg total) by mouth 2 (two) times daily. 12/06/17   Silverio Decamp, MD  Vitamin D, Ergocalciferol, (DRISDOL) 1.25 MG (50000 UT) CAPS capsule Take 1 capsule (50,000 Units total) by mouth every 7 (seven) days. Take for 8 total doses(weeks) 10/09/18   Silverio Decamp, MD    Family History Family History  Problem Relation Age of Onset  . Hypertension Father   . Hyperlipidemia Father   . Hyperlipidemia Mother   . Cancer Maternal Grandmother        cervical  . Cancer  Maternal Grandfather        Throat  . Diabetes Paternal Grandmother   . Heart attack Paternal Grandmother   . Cancer Paternal Grandfather        Leukemia    Social History Social History   Tobacco Use  . Smoking status: Former Smoker    Packs/day: 1.00    Years: 3.00    Pack years: 3.00    Types: Cigarettes    Quit date: 06/20/2009    Years since quitting: 9.6  . Smokeless tobacco: Never Used  . Tobacco comment: tobacco use- no   Substance Use Topics  . Alcohol use: No    Alcohol/week: 0.0 standard drinks  . Drug use: No     Allergies   Patient has no known allergies.   Review of Systems Review of Systems  Constitutional: Positive for activity change, fatigue and unexpected weight change. Negative for chills and fever.  Cardiovascular: Negative.   Gastrointestinal: Positive for abdominal pain, diarrhea and nausea.  Endocrine: Negative.   Genitourinary: Negative.   Musculoskeletal: Negative.    Psychiatric/Behavioral: Negative.      Physical Exam Triage Vital Signs ED Triage Vitals  Enc Vitals Group     BP 02/26/19 0907 (!) 157/100     Pulse Rate 02/26/19 0907 78     Resp --      Temp 02/26/19 0907 98.7 F (37.1 C)     Temp Source 02/26/19 0907 Oral     SpO2 02/26/19 0907 98 %     Weight 02/26/19 0909 125 lb (56.7 kg)     Height 02/26/19 0909 _0  (1.626 m)     Head Circumference --      Peak Flow --      Pain Score 02/26/19 0908 0     Pain Loc --      Pain Edu? --      Excl. in [place]? --    Orthostatic VS for the past 24 hrs:  BP- Lying Pulse- Lying BP- Sitting Pulse- Sitting BP- Standing at 0 minutes Pulse- Standing at 0 minutes  02/26/19 1005 (!) 161/94 73 (!) 144/92 83 (!) 172/94 87    Updated Vital Signs BP (!) 157/100 (BP Location: Right Arm)   Pulse 78   Temp 98.7 F (37.1 C) (Oral)   Ht _1  (1.626 m)   Wt 53.5 kg   SpO2 98%   BMI 20.25 kg/m   Visual Acuity Right Eye Distance:   Left Eye Distance:   Bilateral Distance:    Right Eye Near:   Left Eye Near:    Bilateral Near:     Physical Exam Constitutional:      Comments: Patient appears ill but not toxic.  She is lying on the bed with a cover on.  HENT:     Head: Normocephalic.     Nose: Nose normal.     Mouth/Throat:     Comments: Tongue appeared dry there were no mucosal lesions. Neck:     Musculoskeletal: Normal range of motion.  Cardiovascular:     Rate and Rhythm: Normal rate and regular rhythm.  Pulmonary:     Effort: Pulmonary effort is normal.     Breath sounds: Normal breath sounds.  Abdominal:     Comments: The abdomen is flat.  Bowel sounds are present.  The abdomen is soft without any areas of tenderness.  No masses were felt.    White count 8000.  Hemoglobin 13.6.  Platelets 220,000.  UC  Treatments / Results  Labs (all labs ordered are listed, but only abnormal results are displayed) Labs Reviewed  COMPLETE METABOLIC PANEL WITH GFR - Abnormal; Notable for the  following components:      Result Value   Potassium 3.2 (*)    Alkaline phosphatase (APISO) 25 (*)    All other components within normal limits  POCT URINALYSIS DIP (MANUAL ENTRY) - Abnormal; Notable for the following components:   Ketones, POC UA >= (160) (*)    Blood, UA trace-lysed (*)    All other components within normal limits  NOVEL CORONAVIRUS, NAA  LIPASE  LIPASE  COMPLETE METABOLIC PANEL WITH GFR  GASTROINTESTINAL PATHOGEN PANEL PCR  COMPLETE METABOLIC PANEL WITH GFR  POCT CBC W AUTO DIFF ([facility])    EKG   Radiology No results found.  Procedures Procedures (including critical care time)  Medications Ordered in UC Medications - No data to display  Initial Impression / Assessment and Plan / UC Course  I have reviewed the triage vital signs and the nursing notes.  Pertinent labs & imaging results that were available during my care of the patient were reviewed by me and considered in my medical decision making (see chart for details). Patient had a COVID test 2 days ago which was negative.  Her symptoms have been primarily GI.  Will check CBC and c-Met as well as lipase.  We will also check a GI pathogen panel.  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  Potassium returned low at 3.2 and I called in potassium 10 mEq twice a day .    Final Clinical Impressions(s) / UC Diagnoses   Final diagnoses:  Nausea  Diarrhea, unspecified type  Ketonuria  Loss of weight  Dehydration  Hypokalemia     Discharge Instructions     Please increase your fluid intake. We have done blood work and you will be called when this is back. A repeat COVID test was done today. You have a note to be out of work for the rest of the week. If you continue to be unable to tolerate fluids you will need to go to the emergency room for IV hydration. Please make  an appointment for follow-up with Dr. Dimitri Ped  in 48 hours Please return for container with your stool specimen.    ED Prescriptions    None     Controlled Substance Prescriptions [place] Controlled Substance Registry consulted? Not Applicable   Darlyne Russian, MD 02/26/19 [phone]

## 2019-02-26 NOTE — Telephone Encounter (Signed)
Lue states she is feeling very sick, fatigue. She reports being tested for COVID-19 and it was negative. She states she believes she is dehydrated. She declined to go to the urgent care. Please advise.

## 2019-02-26 NOTE — Discharge Instructions (Addendum)
Please increase your fluid intake. We have done blood work and you will be called when this is back. A repeat COVID test was done today. You have a note to be out of work for the rest of the week. If you continue to be unable to tolerate fluids you will need to go to the emergency room for IV hydration. Please make an appointment for follow-up with Dr. Dimitri Ped  in 48 hours Please return for container with your stool specimen.

## 2019-02-28 ENCOUNTER — Ambulatory Visit: Payer: PRIVATE HEALTH INSURANCE | Admitting: Sports Medicine

## 2019-02-28 ENCOUNTER — Encounter (HOSPITAL_COMMUNITY): Payer: Self-pay

## 2019-02-28 LAB — GASTROINTESTINAL PATHOGEN PANEL PCR
C. difficile Tox A/B, PCR: NOT DETECTED
Campylobacter, PCR: NOT DETECTED
Cryptosporidium, PCR: NOT DETECTED
E coli (ETEC) LT/ST PCR: NOT DETECTED
E coli (STEC) stx1/stx2, PCR: NOT DETECTED
E coli 0157, PCR: NOT DETECTED
Giardia lamblia, PCR: NOT DETECTED
Norovirus, PCR: NOT DETECTED
Rotavirus A, PCR: NOT DETECTED
Salmonella, PCR: NOT DETECTED
Shigella, PCR: NOT DETECTED

## 2019-02-28 LAB — NOVEL CORONAVIRUS, NAA: SARS-CoV-2, NAA: NOT DETECTED

## 2019-03-02 ENCOUNTER — Telehealth: Payer: Self-pay

## 2019-03-02 NOTE — Telephone Encounter (Signed)
Left voice message inquiring about patients status. Encouraged patient to call with questions or concerns. Per Dr Everlene Farrier, f/u with PCP for repeat labs if needed.

## 2019-04-24 ENCOUNTER — Other Ambulatory Visit: Payer: Self-pay

## 2019-04-24 ENCOUNTER — Emergency Department
Admission: EM | Admit: 2019-04-24 | Discharge: 2019-04-24 | Disposition: A | Discharge status: Home / Self Care | Payer: PRIVATE HEALTH INSURANCE | Source: Home / Self Care

## 2019-04-24 DIAGNOSIS — R509 Fever, unspecified: Secondary | ICD-10-CM | POA: Diagnosis not present

## 2019-04-24 DIAGNOSIS — R030 Elevated blood-pressure reading, without diagnosis of hypertension: Secondary | ICD-10-CM

## 2019-04-24 DIAGNOSIS — Z20822 Contact with and (suspected) exposure to covid-19: Secondary | ICD-10-CM

## 2019-04-24 DIAGNOSIS — R6889 Other general symptoms and signs: Secondary | ICD-10-CM

## 2019-04-24 LAB — POC SARS CORONAVIRUS 2 AG -  ED: SARS Coronavirus 2 Ag: NEGATIVE

## 2019-04-24 MED ORDER — TRAMADOL HCL 50 MG PO TABS: 100.0000 mg | ORAL_TABLET | Freq: Three times a day (TID) | ORAL | 3 refills | Status: DC

## 2019-04-24 MED ORDER — ACETAMINOPHEN 325 MG PO TABS
975.0000 mg | ORAL_TABLET | Freq: Once | ORAL | Status: AC
  Administered 2019-04-24: 975 mg via ORAL

## 2019-04-24 NOTE — Telephone Encounter (Signed)
April Nielsen states she is having night sweats, chills, weakness and body aches. No virtual appointments available today. She states she will go to the urgent care.    She was wanting a refill for Tramadol.

## 2019-04-24 NOTE — Discharge Instructions (Signed)
°  You may take 500mg  acetaminophen every 4-6 hours or in combination with ibuprofen 400-600mg  every 6-8 hours as needed for pain, inflammation, and fever.  Be sure to well hydrated with clear liquids and get at least 8 hours of sleep at night, preferably more while sick.   Please follow up with family medicine in 1 week if needed.  Most results have been coming back within about 2-5 days.   If your results are negative, you will NOT be receiving a phone call. You may check your MyChart account, please see in this packet how to set on up if you do not already have one. There is also an app for phones you can download.   You WILL be notified for POSITIVE results.   Due to concern for possibly having Covid-19, it is advised that you self-isolate at home until test results come back.  If positive, it is recommended you stay isolated for at least 10 days after symptom onset and 24 hours after last fever without taking medication (whichever is longer), with improving symptoms.  If you MUST go out, please wear a mask at all times, limit contact with others.

## 2019-04-24 NOTE — ED Provider Notes (Signed)
[facility]    CSN: [id number] Arrival date & time: 04/24/19  1513      History   Chief Complaint Chief Complaint  Patient presents with  . Fever  . Bodyaches  . Chills    HPI April Nielsen is a 43 y.o. female.   HPI April Nielsen is a 43 y.o. female presenting to UC with c/o 5 days of flu-like symptoms with fatigue, body aches, mild congestion, scratchy throat.  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  She has taken ibuprofen, last dose around 12PM today with mild temporary relief.  Denies cough, chest pain or SOB.  No n/v/d. No recent travel. She received her flu vaccine 2 weeks ago.    BP elevated in triage, hx of same w/o dx of HTN.   Past Medical History:  Diagnosis Date  . Abnormal Pap smear of cervix 2010  . Bronchitis   . GERD (gastroesophageal reflux disease)   . Herpes   . Stomach ulcer     Patient Active Problem List   Diagnosis Date Noted  . Polyarthralgia 09/06/2018  . Headache 09/06/2018  . Elevated blood-pressure reading without diagnosis of hypertension 09/06/2018  . Disorder of left temporomandibular joint 08/15/2017  . Peripartum cardiomyopathy 02/24/2015  . Anxiety 10/28/2014  . Dysmenorrhea 05/27/2014  . Abnormal uterine bleeding 05/13/2014  . Thoracic back pain 03/21/2014  . Annual physical exam 11/13/2012  . Right C6 radiculopathy 03/02/2012    Past Surgical History:  Procedure Laterality Date  . c-sections     x3  . CESAREAN SECTION     x 3  . CHOLECYSTECTOMY    . TUBAL LIGATION    . WISDOM TOOTH EXTRACTION      OB History    Gravida  3   Para  3   Term  3   Preterm      AB      Living        SAB      TAB      Ectopic      Multiple      Live Births               Home Medications     Prior to Admission medications   Medication Sig Start Date End Date Taking? Authorizing Provider  acetaminophen (TYLENOL) 500 MG tablet Take 500 mg by mouth.    [provider]  ondansetron (ZOFRAN-ODT) 8 MG disintegrating tablet Take 1 tablet (8 mg total) by mouth every 8 (eight) hours as needed for nausea. 01/01/19   Monica Becton, MD  rizatriptan (MAXALT-MLT) 5 MG disintegrating tablet TAKE ONE TABLET BY MOUTH AT ONSET OF HEADACHE; MAY REPEAT ONE TABLET IN 2 HOURS IF NEEDED. 02/13/19   Monica Becton, MD  traMADol (ULTRAM) 50 MG tablet Take 2 tablets (100 mg total) by mouth 3 (three) times daily. 04/24/19   Monica Becton, MD  valACYclovir (VALTREX) 1000 MG tablet Take 1 tablet (1,000 mg total) by mouth 2 (two) times daily. 12/06/17   Monica Becton, MD  Vitamin D, Ergocalciferol, (DRISDOL) 1.25 MG (50000 UT) CAPS capsule Take 1 capsule (50,000 Units total) by mouth every 7 (seven) days. Take for 8 total doses(weeks) 10/09/18   Thekkekandam,  Ihor Austin, MD    Family History Family History  Problem Relation Age of Onset  . Hypertension Father   . Hyperlipidemia Father   . Hyperlipidemia Mother   . Cancer Maternal Grandmother        cervical  . Cancer Maternal Grandfather        Throat  . Diabetes Paternal Grandmother   . Heart attack Paternal Grandmother   . Cancer Paternal Grandfather        Leukemia    Social History Social History   Tobacco Use  . Smoking status: Former Smoker    Packs/day: 1.00    Years: 3.00    Pack years: 3.00    Types: Cigarettes    Quit date: 06/20/2009    Years since quitting: 9.8  . Smokeless tobacco: Never Used  . Tobacco comment: tobacco use- no   Substance Use Topics  . Alcohol use: No    Alcohol/week: 0.0 standard drinks  . Drug use: No     Allergies   Patient has no known allergies.   Review of Systems Review of Systems  Constitutional: Positive for chills, fatigue and fever (low grade).  HENT:  Positive for congestion, postnasal drip and sore throat (scratchy ). Negative for ear pain, trouble swallowing and voice change.   Respiratory: Positive for cough. Negative for shortness of breath.   Cardiovascular: Negative for chest pain and palpitations.  Gastrointestinal: Negative for abdominal pain, diarrhea, nausea and vomiting.  Musculoskeletal: Positive for arthralgias, back pain and myalgias.  Skin: Negative for rash.  Neurological: Positive for headaches (mild). Negative for dizziness and light-headedness.     Physical Exam Triage Vital Signs ED Triage Vitals  Enc Vitals Group     BP 04/24/19 1529 (!) 164/104     Pulse Rate 04/24/19 1529 (!) 114     Resp 04/24/19 1529 18     Temp 04/24/19 1529 99.1 F (37.3 C)     Temp Source 04/24/19 1529 Oral     SpO2 04/24/19 1529 99 %     Weight 04/24/19 1530 117 lb 15.1 oz (53.5 kg)     Height 04/24/19 1530 5\' 4"  (1.626 m)     Head Circumference --      Peak Flow --      Pain Score 04/24/19 1625 0     Pain Loc --      Pain Edu? --      Excl. in [place]? --    No data found.  Updated Vital Signs BP (!) 164/104 (BP Location: Left Arm)   Pulse 89   Temp 99.1 F (37.3 C) (Oral)   Resp 18   Ht 5\' 4"  (1.626 m)   Wt 117 lb 15.1 oz (53.5 kg)   LMP  (LMP Unknown)   SpO2 99%   BMI 20.25 kg/m   Visual Acuity Right Eye Distance:   Left Eye Distance:   Bilateral Distance:    Right Eye Near:   Left Eye Near:    Bilateral Near:     Physical Exam Vitals signs and nursing note reviewed.  Constitutional:      General: She is not in acute distress.    Appearance: Normal appearance. She is well-developed.     Comments: Pt sitting on exam bed with hooded sweatshirt on, appears uncomfortable and acutely sick but alert and oriented, cooperative during exam. No acute distress.   HENT:     Head: Normocephalic and atraumatic.     Right Ear: Tympanic membrane  and ear canal normal.     Left Ear: Tympanic membrane and ear canal normal.      Nose: Nose normal.     Mouth/Throat:     Mouth: Mucous membranes are moist.  Eyes:     Extraocular Movements: Extraocular movements intact.  Neck:     Musculoskeletal: Normal range of motion.  Cardiovascular:     Rate and Rhythm: Normal rate and regular rhythm.     Comments: Initially tachycardic in triage, regular rate and rhythm on exam. Pulmonary:     Effort: Pulmonary effort is normal. No respiratory distress.     Breath sounds: Normal breath sounds. No stridor. No wheezing, rhonchi or rales.  Musculoskeletal: Normal range of motion.  Skin:    General: Skin is warm and dry.     Capillary Refill: Capillary refill takes less than 2 seconds.     Findings: No rash.  Neurological:     Mental Status: She is alert and oriented to person, place, and time.  Psychiatric:        Behavior: Behavior normal.      UC Treatments / Results  Labs (all labs ordered are listed, but only abnormal results are displayed) Labs Reviewed  NOVEL CORONAVIRUS, NAA  POC SARS CORONAVIRUS 2 ED    EKG   Radiology No results found.  Procedures Procedures (including critical care time)  Medications Ordered in UC Medications  acetaminophen (TYLENOL) tablet 975 mg (975 mg Oral Given 04/24/19 1550)    Initial Impression / Assessment and Plan / UC Course  I have reviewed the triage vital signs and the nursing notes.  Pertinent labs & imaging results that were available during my care of the patient were reviewed by me and considered in my medical decision making (see chart for details).     Rapid Covid: NEGATIVE Covid send-out test pending.  No flu test performed because pt is well outside recommended 48 hour treatment window, she received flu vaccine 2 weeks ago and no active cases of flu reported in area this season.  Encouraged symptomatic tx AVS and work note provided   Final Clinical Impressions(s) / UC Diagnoses   Final diagnoses:  Fever, unspecified  Flu-like symptoms   Suspected COVID-19 virus infection  Elevated blood pressure reading     Discharge Instructions      You may take 500mg  acetaminophen every 4-6 hours or in combination with ibuprofen 400-600mg  every 6-8 hours as needed for pain, inflammation, and fever.  Be sure to well hydrated with clear liquids and get at least 8 hours of sleep at night, preferably more while sick.   Please follow up with family medicine in 1 week if needed.  Most results have been coming back within about 2-5 days.   If your results are negative, you will NOT be receiving a phone call. You may check your MyChart account, please see in this packet how to set on up if you do not already have one. There is also an app for phones you can download.   You WILL be notified for POSITIVE results.   Due to concern for possibly having Covid-19, it is advised that you self-isolate at home until test results come back.  If positive, it is recommended you stay isolated for at least 10 days after symptom onset and 24 hours after last fever without taking medication (whichever is longer), with improving symptoms.  If you MUST go out, please wear a mask at all times, limit contact with others.  ED Prescriptions    None     PDMP not reviewed this encounter.   Noe Gens, [place] 04/24/19 1923

## 2019-04-24 NOTE — ED Triage Notes (Signed)
Pt c/o flu like sxs since Saturday. No known direct COVID exposure but does work at [employer]. Had flu shot 2 weeks ago. Taking advil prn.

## 2019-04-25 LAB — NOVEL CORONAVIRUS, NAA: SARS-CoV-2, NAA: NOT DETECTED

## 2019-04-30 ENCOUNTER — Other Ambulatory Visit: Payer: Self-pay | Admitting: Sports Medicine

## 2019-04-30 ENCOUNTER — Telehealth: Payer: Self-pay

## 2019-04-30 MED ORDER — GABAPENTIN 300 MG PO CAPS: 300.0000 mg | ORAL_CAPSULE | Freq: Three times a day (TID) | ORAL | 3 refills | Status: DC

## 2019-04-30 MED ORDER — LACTATED RINGERS IV SOLN
INTRAVENOUS | Status: DC
Start: ? — End: 2019-04-30

## 2019-04-30 NOTE — Telephone Encounter (Signed)
Patient advised of recommendations.  

## 2019-04-30 NOTE — Telephone Encounter (Signed)
See other note. Open this in error.

## 2019-04-30 NOTE — Telephone Encounter (Signed)
I would definitely like to see this before simply throwing nystatin added for thrush.  Oral thrush is actually rare in immunocompetent individuals, and burning or painful red and white patches on the tongue and mouth are more likely geographic tongue or aphthous ulcers.  The treatment is different for these.

## 2019-04-30 NOTE — Telephone Encounter (Signed)
April Nielsen states she was seen in the ED for multiple symptoms. She has had a fever, vomiting, night sweats, increased heart rate and diarrhea. She was treated for hypokalemia, trichomoniasis and cystitis. She is not feeling much better. She has a virtual appointment with [employer] [facility].   She wanted to know if she could get something for thrush. She has white and red patches in her mouth. Please advise.

## 2019-05-01 ENCOUNTER — Ambulatory Visit (INDEPENDENT_AMBULATORY_CARE_PROVIDER_SITE_OTHER): Payer: PRIVATE HEALTH INSURANCE | Admitting: Physician Assistant

## 2019-05-01 ENCOUNTER — Encounter: Payer: Self-pay | Admitting: Physician Assistant

## 2019-05-01 VITALS — HR 120 | Temp 101.2°F | Ht 64.0 in | Wt 117.0 lb

## 2019-05-01 DIAGNOSIS — N3090 Cystitis, unspecified without hematuria: Secondary | ICD-10-CM

## 2019-05-01 DIAGNOSIS — R509 Fever, unspecified: Secondary | ICD-10-CM | POA: Diagnosis not present

## 2019-05-01 DIAGNOSIS — E876 Hypokalemia: Secondary | ICD-10-CM | POA: Insufficient documentation

## 2019-05-01 DIAGNOSIS — R000 Tachycardia, unspecified: Secondary | ICD-10-CM | POA: Diagnosis not present

## 2019-05-01 DIAGNOSIS — Z113 Encounter for screening for infections with a predominantly sexual mode of transmission: Secondary | ICD-10-CM

## 2019-05-01 DIAGNOSIS — F418 Other specified anxiety disorders: Secondary | ICD-10-CM | POA: Insufficient documentation

## 2019-05-01 NOTE — Progress Notes (Signed)
States yesterday Tongue - white, throat - red, took Diflucan and that is better For UTI and Trichomonas has been on Antibiotics - 5 days  Feels better but increased Temp (101.2) Heart rate between 107-120 Mild night sweats

## 2019-05-01 NOTE — Progress Notes (Signed)
Patient ID: April Nielsen, female   DOB: [birth year], 43 y.o.   MRN: [id number] .Marland KitchenVirtual Visit via Video Note  I connected with April Nielsen on 05/01/19 at 10:50 AM EST by a video enabled telemedicine application and verified that I am speaking with the correct person using two identifiers.  Location: Patient: home Provider: clinic   I discussed the limitations of evaluation and management by telemedicine and the availability of in person appointments. The patient expressed understanding and agreed to proceed.  History of Present Illness: Patient is a 43 year old female who calls into the clinic with multiple concerns and symptoms today.  She originally called in because she was having white stuff on her tongue and a sore throat.  She thought it could be thrush so she took a leftover Diflucan.  Symptoms of sore throat and white patches have resolved today.  She has had a few weeks of feeling bad.  She has had 3 ED visits in the past 2 weeks.    Symptoms for started with sore throat and fever where she went to ED on 04/24/19.  Covid testing was negative.  She was treated with symptomatic care like viral illness.  She went back to the emergency department on 04/26/2019 since symptoms had not cleared.  She was found to have hypokalemia, cystitis, trichomonas. WBC was 10.2. Potassium was 3.0. She was treated with fluids, Rocephin IM, azithromycin slurry, Flagyl, Macrobid.  She returned back to ED on 04/29/19 after continuing to feel "unwell" with body aches, malaise, fever for the past week. Due to psychiatric history and mania behavior consulted [facility]. UDS was negative. Repeat potassium was 3.5. negative flu. EKG NSR. Providers felt there was some psychiatric issues underyling her known physical dx.   Today she calls in due to spikes in fever, bouts of tachycardia and feeling like her heart is racing. She is concerned about HIV and why she can't start to feel better. No IV drug use since she was a  teenager. 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 No SOB or swelling today.   Pt has been on flagyl before but not macrobid.    .. Active Ambulatory Problems    Diagnosis Date Noted  . Right C6 radiculopathy 03/02/2012  . Annual physical exam 11/13/2012  . Thoracic back pain 03/21/2014  . Abnormal uterine bleeding 05/13/2014  . Dysmenorrhea 05/27/2014  . Anxiety 10/28/2014  . Peripartum cardiomyopathy 02/24/2015  . Disorder of left temporomandibular joint 08/15/2017  . Polyarthralgia 09/06/2018  . Headache 09/06/2018  . Elevated blood-pressure reading without diagnosis of hypertension 09/06/2018   Resolved Ambulatory Problems    Diagnosis Date Noted  . BRADYCARDIA 03/20/2009  . BRONCHITIS 03/20/2009  . DYSPNEA 03/20/2009  . Abdominal pain, epigastric 10/16/2012  . Acute bronchitis 03/21/2014  . Panic attack 10/28/2014  . Loss of weight 10/28/2014  . Night sweats 10/28/2014  . Skin tag 04/21/2015  . Acute bronchitis 04/21/2015  . Influenza-like illness 07/08/2016   Past Medical History:  Diagnosis Date  . Abnormal Pap smear of cervix 2010  . Bronchitis   . GERD (gastroesophageal reflux disease)   . Herpes   . Stomach ulcer    Reviewed med, allergy, problem list.    Observations/Objective: Pt is very anxious.  No labored breathing.  Mood is worried but normal.  Normal appearance.  No edema. No ulcers or white patches seen in mouth.   .. Today's Vitals   05/01/19 1027  Pulse: Marland Kitchen)  120  Temp: (!) 101.2 F (38.4 C)  TempSrc: Oral  Weight: 117 lb (53.1 kg)  Height: 5\' 4"  (1.626 m)   Body mass index is 20.08 kg/m.    Assessment and Plan: Marland KitchenMarland KitchenAmeah was seen today for tachycardia and night sweats.  Diagnoses and all orders for this visit:  Tachycardia -     CBC w/Diff -     Basic metabolic panel -     TSH -     Magnesium  Screening examination for STD (sexually transmitted disease) -     HIV antibody  (with reflex) -     RPR -     Magnesium  Hypokalemia -     Basic metabolic panel  Fever, unspecified fever cause -     CBC w/Diff -     HIV antibody (with reflex) -     Basic metabolic panel  Cystitis  Anxiety about health  unclear etiology of tachycardia and fever.   Pt reports HR of 120 today and temperature of 101.2. She has had normal recent EKG and troponin. She feels "like something is wrong". Negative Covid test and flu test. With rocephin and macrobid UtI should be treated. Could be side effect to macrobid. Stop macrobid. Finish flagyl. No swelling or SOB to think acute HF. Will recheck potassium, magnesium, TSH, bmp, CBC. Will check HIV/RPR to complete STD screening. If labs are negative she could be having some type of adjustment disorder due to health concerns and stress. Keep close follow up. We could order zio patch to evaluate tachycardia in the future.   Follow up in 2 days.    Follow Up Instructions:    I discussed the assessment and treatment plan with the patient. The patient was provided an opportunity to ask questions and all were answered. The patient agreed with the plan and demonstrated an understanding of the instructions.   The patient was advised to call back or seek an in-person evaluation if the symptoms worsen or if the condition fails to improve as anticipated.  Spent 25 minutes on phone with patient and 35 minutes in coordinated care and in EMR.   Iran Planas, PA-C

## 2019-05-02 ENCOUNTER — Other Ambulatory Visit: Payer: Self-pay | Admitting: Neurology

## 2019-05-02 DIAGNOSIS — E876 Hypokalemia: Secondary | ICD-10-CM

## 2019-05-02 LAB — BASIC METABOLIC PANEL
BUN/Creatinine Ratio: 13 (calc) (ref 6–22)
BUN: 5 mg/dL — ABNORMAL LOW (ref 7–25)
CO2: 27 mmol/L (ref 20–32)
Calcium: 10 mg/dL (ref 8.6–10.2)
Chloride: 104 mmol/L (ref 98–110)
Creat: 0.39 mg/dL — ABNORMAL LOW (ref 0.50–1.10)
Glucose, Bld: 96 mg/dL (ref 65–99)
Potassium: 3.3 mmol/L — ABNORMAL LOW (ref 3.5–5.3)
Sodium: 141 mmol/L (ref 135–146)

## 2019-05-02 LAB — CBC WITH DIFFERENTIAL/PLATELET
Absolute Monocytes: 586 cells/uL (ref 200–950)
Basophils Absolute: 40 cells/uL (ref 0–200)
Basophils Relative: 0.4 %
Eosinophils Absolute: 30 cells/uL (ref 15–500)
Eosinophils Relative: 0.3 %
HCT: 37.5 % (ref 35.0–45.0)
Hemoglobin: 13.1 g/dL (ref 11.7–15.5)
Lymphs Abs: 929 cells/uL (ref 850–3900)
MCH: 30.8 pg (ref 27.0–33.0)
MCHC: 34.9 g/dL (ref 32.0–36.0)
MCV: 88 fL (ref 80.0–100.0)
MPV: 10 fL (ref 7.5–12.5)
Monocytes Relative: 5.8 %
Neutro Abs: 8514 cells/uL — ABNORMAL HIGH (ref 1500–7800)
Neutrophils Relative %: 84.3 %
Platelets: 252 10*3/uL (ref 140–400)
RBC: 4.26 10*6/uL (ref 3.80–5.10)
RDW: 12.8 % (ref 11.0–15.0)
Total Lymphocyte: 9.2 %
WBC: 10.1 10*3/uL (ref 3.8–10.8)

## 2019-05-02 LAB — TSH: TSH: 0.77 mIU/L

## 2019-05-02 LAB — HIV ANTIBODY (ROUTINE TESTING W REFLEX): HIV 1&2 Ab, 4th Generation: NONREACTIVE

## 2019-05-02 LAB — RPR: RPR Ser Ql: NONREACTIVE

## 2019-05-02 LAB — MAGNESIUM: Magnesium: 1.8 mg/dL (ref 1.5–2.5)

## 2019-05-02 MED ORDER — POTASSIUM CHLORIDE ER 10 MEQ PO TBCR: 10.0000 meq | EXTENDED_RELEASE_TABLET | Freq: Every day | ORAL | 0 refills | Status: DC

## 2019-05-02 NOTE — Progress Notes (Signed)
April Nielsen,   WBC continues to be normal. Not anemic.thyroid and magnesium normal. Your potassium is still a little low. It is up from first visit in ED at 3. But down from 2nd ED visit at 3.5. I am going to send over 62meq tablet of potassium to take daily. Recheck potassium in one week.

## 2019-05-02 NOTE — Addendum Note (Signed)
Addended by: Donella Stade on: 05/02/2019 06:35 AM   Modules accepted: Orders

## 2019-05-03 ENCOUNTER — Encounter: Payer: Self-pay | Admitting: Physician Assistant

## 2019-05-07 ENCOUNTER — Encounter: Payer: Self-pay | Admitting: Sports Medicine

## 2019-05-13 ENCOUNTER — Ambulatory Visit: Payer: PRIVATE HEALTH INSURANCE | Admitting: Sports Medicine

## 2019-05-24 ENCOUNTER — Other Ambulatory Visit: Payer: Self-pay

## 2019-05-24 ENCOUNTER — Other Ambulatory Visit: Payer: Self-pay | Admitting: Sports Medicine

## 2019-05-24 ENCOUNTER — Ambulatory Visit (INDEPENDENT_AMBULATORY_CARE_PROVIDER_SITE_OTHER): Payer: PRIVATE HEALTH INSURANCE

## 2019-05-24 ENCOUNTER — Encounter: Payer: Self-pay | Admitting: Sports Medicine

## 2019-05-24 ENCOUNTER — Ambulatory Visit (INDEPENDENT_AMBULATORY_CARE_PROVIDER_SITE_OTHER): Payer: PRIVATE HEALTH INSURANCE | Admitting: Sports Medicine

## 2019-05-24 DIAGNOSIS — R7612 Nonspecific reaction to cell mediated immunity measurement of gamma interferon antigen response without active tuberculosis: Secondary | ICD-10-CM

## 2019-05-24 DIAGNOSIS — R509 Fever, unspecified: Secondary | ICD-10-CM

## 2019-05-24 NOTE — Assessment & Plan Note (Signed)
Unclear etiology with resolved severe upper GI symptoms, adding chest x-ray, urinalysis with urine culture, D-dimer, blood cultures, echocardiogram, EBV antibodies, QuantiFERON gold testing, Covid antibodies, norovirus PCR, lymphoma evaluation panel. HIV was negative.

## 2019-05-24 NOTE — Progress Notes (Signed)
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  I reviewed the past medical history, family history, social history, surgical history, and allergies today and no changes were needed.  Please see the problem list section below in epic for further details.  Past Medical History: Past Medical History:  Diagnosis Date  . Abnormal Pap smear of cervix 2010  . Bronchitis   . GERD (gastroesophageal reflux disease)   . Herpes   . Stomach ulcer    Past Surgical History: Past Surgical History:  Procedure Laterality Date  . c-sections     x3  . CESAREAN SECTION     x 3  . CHOLECYSTECTOMY    . TUBAL LIGATION    . WISDOM TOOTH EXTRACTION     Social History: Social History   Socioeconomic History  . Marital status: Single    Spouse name: Not on file  . Number of children: Not on file  . Years of education: Not on file  . Highest education level: Not on file  Occupational History  . Occupation: Charity fundraiser  Social Needs  . Financial resource strain: Not on file  . Food insecurity    Worry: Not on file    Inability: Not on file  . Transportation needs    Medical: Not on file    Non-medical: Not on file  Tobacco Use  . Smoking status: Former Smoker    Packs/day: 1.00    Years: 3.00    Pack years: 3.00    Types: Cigarettes    Quit date: 06/20/2009    Years since quitting: 9.9  . Smokeless tobacco: Never Used  . Tobacco comment: tobacco use- no   Substance and Sexual Activity  . Alcohol use: No    Alcohol/week: 0.0 standard drinks  . Drug use: No  . Sexual activity: Yes    Partners: Male    Birth control/protection: Surgical  Lifestyle  . Physical activity    Days per week: Not on file    Minutes per session: Not on file  . Stress: Not on file  Relationships  . Social Musician on phone: Not on file   Gets together: Not on file    Attends religious service: Not on file    Active member of club or organization: Not on file    Attends meetings of clubs or organizations: Not on file    Relationship status: Not on file  Other Topics Concern  . Not on file  Social History Narrative  . Not on file   Family History: Family History  Problem Relation Age of Onset  . Hypertension Father   . Hyperlipidemia Father   . Hyperlipidemia Mother   . Cancer Maternal Grandmother        cervical  . Cancer Maternal Grandfather        Throat  . Diabetes Paternal Grandmother   . Heart attack Paternal Grandmother   . Cancer Paternal Grandfather        Leukemia   Allergies: No Known Allergies Medications: See med rec.  Review of Systems: No fevers, chills, night sweats, weight loss, chest pain, or shortness of breath.   Objective:    General: Well Developed, well nourished, and in no acute distress.  Neuro: Alert and oriented x3, extra-ocular muscles intact, sensation grossly intact.  HEENT:  Normocephalic, atraumatic, pupils equal round reactive to light, neck supple, no masses, no lymphadenopathy, thyroid nonpalpable.  Oropharynx, nasopharynx and ear canals unremarkable. Skin: Warm and dry, no rashes. Cardiac: Regular rate and rhythm, no murmurs rubs or gallops, no lower extremity edema.  Respiratory: Clear to auscultation bilaterally. Not using accessory muscles, speaking in full sentences. Abdomen: Soft, nontender, nondistended, no bowel sounds, no palpable masses, no guarding, rigidity, rebound tenderness.  Impression and Recommendations:    Fever without a source Unclear etiology with resolved severe upper GI symptoms, adding chest x-ray, urinalysis with urine culture, D-dimer, blood cultures, echocardiogram, EBV antibodies, QuantiFERON gold testing, Covid antibodies, norovirus PCR, lymphoma evaluation panel. HIV was negative.   ___________________________________________ Gwen Her.  Dianah Field, M.D., ABFM., CAQSM. Primary Care and Sports Medicine [facility]  Adjunct Professor of [employer]

## 2019-05-28 ENCOUNTER — Other Ambulatory Visit: Payer: Self-pay | Admitting: Physician Assistant

## 2019-05-28 NOTE — Telephone Encounter (Signed)
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 Please review to see if she should continue medication. Looks like due for repeat labs.

## 2019-05-29 ENCOUNTER — Telehealth: Payer: Self-pay | Admitting: Sports Medicine

## 2019-05-29 DIAGNOSIS — R7612 Nonspecific reaction to cell mediated immunity measurement of gamma interferon antigen response without active tuberculosis: Secondary | ICD-10-CM | POA: Insufficient documentation

## 2019-05-29 DIAGNOSIS — A0811 Acute gastroenteropathy due to Norwalk agent: Secondary | ICD-10-CM

## 2019-05-29 NOTE — Telephone Encounter (Signed)
Patient called and stated that she thought you were wanting to order a stool sample for Noro virus. If so please let me know how you would like it ordered since she has to have it sent to [facility]. Please advise.   Fax [phone].

## 2019-05-29 NOTE — Telephone Encounter (Signed)
Norovirus order placed through [facility].

## 2019-05-30 ENCOUNTER — Ambulatory Visit (HOSPITAL_BASED_OUTPATIENT_CLINIC_OR_DEPARTMENT_OTHER)
Admission: RE | Admit: 2019-05-30 | Discharge: 2019-05-30 | Disposition: A | Discharge status: Home / Self Care | Payer: PRIVATE HEALTH INSURANCE | Source: Ambulatory Visit | Attending: Sports Medicine | Admitting: Sports Medicine

## 2019-05-30 ENCOUNTER — Other Ambulatory Visit: Payer: Self-pay

## 2019-05-30 DIAGNOSIS — R509 Fever, unspecified: Secondary | ICD-10-CM | POA: Diagnosis not present

## 2019-05-30 NOTE — Progress Notes (Signed)
  Echocardiogram 2D Echocardiogram has been performed.  April Nielsen 05/30/2019, 2:05 PM

## 2019-05-30 NOTE — Telephone Encounter (Signed)
Labs faxed to [facility] for the patient.

## 2019-06-02 LAB — MICROSCOPIC EXAMINATION
Casts: NONE SEEN /lpf
Epithelial Cells (non renal): >10 /hpf — AB (ref 0–10)

## 2019-06-02 LAB — CBC WITH DIFFERENTIAL/PLATELET
Basophils Absolute: 0 10*3/uL (ref 0.0–0.2)
Basos: 1 %
EOS (ABSOLUTE): 0.1 10*3/uL (ref 0.0–0.4)
Eos: 2 %
Hematocrit: 37.2 % (ref 34.0–46.6)
Hemoglobin: 12.8 g/dL (ref 11.1–15.9)
Immature Grans (Abs): 0 10*3/uL (ref 0.0–0.1)
Immature Granulocytes: 0 %
Lymphocytes Absolute: 1.2 10*3/uL (ref 0.7–3.1)
Lymphs: 24 %
MCH: 31 pg (ref 26.6–33.0)
MCHC: 34.4 g/dL (ref 31.5–35.7)
MCV: 90 fL (ref 79–97)
Monocytes Absolute: 0.4 10*3/uL (ref 0.1–0.9)
Monocytes: 8 %
Neutrophils Absolute: 3.3 10*3/uL (ref 1.4–7.0)
Neutrophils: 65 %
Platelets: 225 10*3/uL (ref 150–450)
RBC: 4.13 x10E6/uL (ref 3.77–5.28)
RDW: 13.1 % (ref 11.7–15.4)
WBC: 5.1 10*3/uL (ref 3.4–10.8)

## 2019-06-02 LAB — QFT-TB PLUS (CLIENT INCUBATED)
QuantiFERON Mitogen Value: 0.28 IU/mL
QuantiFERON Nil Value: 0.02 IU/mL
QuantiFERON TB1 Ag Value: 0.02 IU/mL
QuantiFERON TB2 Ag Value: 0.01 IU/mL
QuantiFERON-TB Gold Plus: UNDETERMINED — AB

## 2019-06-02 LAB — EUROIMMUN SARS-COV-2 AB, IGG: Euroimmun SARS-CoV-2 Ab, IgG: NEGATIVE

## 2019-06-02 LAB — COMPREHENSIVE METABOLIC PANEL
ALT: 10 IU/L (ref 0–32)
AST: 14 IU/L (ref 0–40)
Albumin/Globulin Ratio: 2.5 — ABNORMAL HIGH (ref 1.2–2.2)
Albumin: 4.9 g/dL — ABNORMAL HIGH (ref 3.8–4.8)
Alkaline Phosphatase: 29 IU/L — ABNORMAL LOW (ref 39–117)
BUN/Creatinine Ratio: 23 (ref 9–23)
BUN: 14 mg/dL (ref 6–24)
Bilirubin Total: 0.5 mg/dL (ref 0.0–1.2)
CO2: 24 mmol/L (ref 20–29)
Calcium: 9.8 mg/dL (ref 8.7–10.2)
Chloride: 103 mmol/L (ref 96–106)
Creatinine, Ser: 0.6 mg/dL (ref 0.57–1.00)
GFR calc Af Amer: 130 mL/min/{1.73_m2} (ref >59)
GFR calc non Af Amer: 113 mL/min/{1.73_m2} (ref >59)
Globulin, Total: 2 g/dL (ref 1.5–4.5)
Glucose: 82 mg/dL (ref 65–99)
Potassium: 4.3 mmol/L (ref 3.5–5.2)
Sodium: 141 mmol/L (ref 134–144)
Total Protein: 6.9 g/dL (ref 6.0–8.5)

## 2019-06-02 LAB — URINALYSIS, COMPLETE
Bilirubin, UA: NEGATIVE
Glucose, UA: NEGATIVE
Ketones, UA: NEGATIVE
Leukocytes,UA: NEGATIVE
Nitrite, UA: NEGATIVE
Protein,UA: NEGATIVE
RBC, UA: NEGATIVE
Specific Gravity, UA: 1.024 (ref 1.005–1.030)
Urobilinogen, Ur: 0.2 mg/dL (ref 0.2–1.0)
pH, UA: 5.5 (ref 5.0–7.5)

## 2019-06-02 LAB — SAR COV2 SEROLOGY (COVID19)AB(IGG),IA

## 2019-06-02 LAB — COMP PANEL: LEUKEMIA/LYMPHOMA

## 2019-06-02 LAB — MONONUCLEOSIS SCREEN: Mono Screen: NEGATIVE

## 2019-06-02 LAB — D-DIMER, QUANTITATIVE: D-DIMER: 0.27 mg{FEU}/L (ref 0.00–0.49)

## 2019-06-02 LAB — CULTURE, BLOOD (SINGLE)

## 2019-06-03 NOTE — Telephone Encounter (Signed)
I checked her labs just a couple of days ago, her potassium levels were normal so she can discontinue this.

## 2019-06-10 ENCOUNTER — Encounter: Payer: Self-pay | Admitting: Sports Medicine

## 2019-06-10 ENCOUNTER — Other Ambulatory Visit: Payer: Self-pay

## 2019-06-10 ENCOUNTER — Ambulatory Visit (INDEPENDENT_AMBULATORY_CARE_PROVIDER_SITE_OTHER): Payer: PRIVATE HEALTH INSURANCE | Admitting: Sports Medicine

## 2019-06-10 DIAGNOSIS — R509 Fever, unspecified: Secondary | ICD-10-CM

## 2019-06-10 NOTE — Assessment & Plan Note (Addendum)
April Nielsen seems to be doing a little bit better, she still has intermittent fevers, extensive work-up as before was completely negative. 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  Update 06/17/2019 PPD is negative.  No induration.

## 2019-06-10 NOTE — Progress Notes (Addendum)
Subjective:    CC: Follow-up  HPI: Fever without a source: Improving.  She did have a indeterminate QuantiFERON gold.  I reviewed the past medical history, family history, social history, surgical history, and allergies today and no changes were needed.  Please see the problem list section below in epic for further details.  Past Medical History: Past Medical History:  Diagnosis Date  . Abnormal Pap smear of cervix 2010  . Bronchitis   . GERD (gastroesophageal reflux disease)   . Herpes   . Stomach ulcer    Past Surgical History: Past Surgical History:  Procedure Laterality Date  . c-sections     x3  . CESAREAN SECTION     x 3  . CHOLECYSTECTOMY    . TUBAL LIGATION    . WISDOM TOOTH EXTRACTION     Social History: Social History   Socioeconomic History  . Marital status: Single    Spouse name: Not on file  . Number of children: Not on file  . Years of education: Not on file  . Highest education level: Not on file  Occupational History  . Occupation: Therapist, sports  Tobacco Use  . Smoking status: Former Smoker    Packs/day: 1.00    Years: 3.00    Pack years: 3.00    Types: Cigarettes    Quit date: 06/20/2009    Years since quitting: 9.9  . Smokeless tobacco: Never Used  . Tobacco comment: tobacco use- no   Substance and Sexual Activity  . Alcohol use: No    Alcohol/week: 0.0 standard drinks  . Drug use: No  . Sexual activity: Yes    Partners: Male    Birth control/protection: Surgical  Other Topics Concern  . Not on file  Social History Narrative  . Not on file   Social Determinants of Health   Financial Resource Strain:   . Difficulty of Paying Living Expenses: Not on file  Food Insecurity:   . Worried About Charity fundraiser in the Last Year: Not on file  . Ran Out of Food in the Last Year: Not on file  Transportation Needs:   . Lack of Transportation (Medical): Not on file  . Lack of Transportation (Non-Medical): Not on file  Physical Activity:   . Days  of Exercise per Week: Not on file  . Minutes of Exercise per Session: Not on file  Stress:   . Feeling of Stress : Not on file  Social Connections:   . Frequency of Communication with Friends and Family: Not on file  . Frequency of Social Gatherings with Friends and Family: Not on file  . Attends Religious Services: Not on file  . Active Member of Clubs or Organizations: Not on file  . Attends Archivist Meetings: Not on file  . Marital Status: Not on file   Family History: Family History  Problem Relation Age of Onset  . Hypertension Father   . Hyperlipidemia Father   . Hyperlipidemia Mother   . Cancer Maternal Grandmother        cervical  . Cancer Maternal Grandfather        Throat  . Diabetes Paternal Grandmother   . Heart attack Paternal Grandmother   . Cancer Paternal Grandfather        Leukemia   Allergies: No Known Allergies Medications: See med rec.  Review of Systems: No fevers, chills, night sweats, weight loss, chest pain, or shortness of breath.   Objective:    General: Well  Developed, well nourished, and in no acute distress.  Neuro: Alert and oriented x3, extra-ocular muscles intact, sensation grossly intact.  HEENT: Normocephalic, atraumatic, pupils equal round reactive to light, neck supple, no masses, no lymphadenopathy, thyroid nonpalpable.  Skin: Warm and dry, no rashes. Cardiac: Regular rate and rhythm, no murmurs rubs or gallops, no lower extremity edema.  Respiratory: Clear to auscultation bilaterally. Not using accessory muscles, speaking in full sentences.  Impression and Recommendations:    Fever without a source Belvia seems to be doing a little bit better, she still has intermittent fevers, extensive work-up as before was completely negative. 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  Update 06/17/2019 PPD is negative.  No induration.    ___________________________________________ Ihor Austin. Benjamin Stain, M.D., ABFM., CAQSM. Primary Care and Sports Medicine [facility]  Adjunct Professor of Family Medicine  [employer]

## 2019-06-17 NOTE — Telephone Encounter (Signed)
Would you please add that she had a negative PPD into wherever it needs to go?

## 2019-06-18 LAB — TB SKIN TEST
Induration: 0 mm
TB Skin Test: NEGATIVE

## 2019-06-22 ENCOUNTER — Other Ambulatory Visit: Payer: Self-pay | Admitting: Sports Medicine

## 2019-06-22 DIAGNOSIS — R519 Headache, unspecified: Secondary | ICD-10-CM

## 2019-07-31 ENCOUNTER — Other Ambulatory Visit: Payer: Self-pay | Admitting: Sports Medicine

## 2019-07-31 DIAGNOSIS — R519 Headache, unspecified: Secondary | ICD-10-CM

## 2019-08-20 MED ORDER — TRAMADOL HCL 50 MG PO TABS: 100.0000 mg | ORAL_TABLET | Freq: Three times a day (TID) | ORAL | 3 refills | Status: DC

## 2019-08-26 ENCOUNTER — Other Ambulatory Visit: Payer: Self-pay | Admitting: Sports Medicine

## 2019-10-01 ENCOUNTER — Telehealth: Payer: Self-pay | Admitting: Sports Medicine

## 2019-10-01 DIAGNOSIS — R519 Headache, unspecified: Secondary | ICD-10-CM

## 2019-10-01 MED ORDER — RIZATRIPTAN BENZOATE 5 MG PO TBDP: ORAL_TABLET | ORAL | 11 refills | Status: DC

## 2019-10-01 NOTE — Telephone Encounter (Signed)
Done

## 2019-10-01 NOTE — Telephone Encounter (Signed)
Patient stopped by stating that she needed a refill of the medication listed below. AM  rizatriptan (MAXALT-MLT) 5 MG disintegrating tablet  [facility] [address] - [place], [place] - [phone] Phone:  [phone]

## 2019-10-14 ENCOUNTER — Other Ambulatory Visit: Payer: Self-pay

## 2019-10-14 ENCOUNTER — Ambulatory Visit (INDEPENDENT_AMBULATORY_CARE_PROVIDER_SITE_OTHER): Payer: PRIVATE HEALTH INSURANCE | Admitting: Sports Medicine

## 2019-10-14 DIAGNOSIS — R519 Headache, unspecified: Secondary | ICD-10-CM

## 2019-10-14 DIAGNOSIS — G43709 Chronic migraine without aura, not intractable, without status migrainosus: Secondary | ICD-10-CM | POA: Diagnosis not present

## 2019-10-14 MED ORDER — TOPIRAMATE 50 MG PO TABS: ORAL_TABLET | ORAL | 3 refills | Status: DC

## 2019-10-14 MED ORDER — RIZATRIPTAN BENZOATE 10 MG PO TBDP: ORAL_TABLET | ORAL | 11 refills | Status: DC

## 2019-10-14 NOTE — Progress Notes (Signed)
    Procedures performed today:    None.  Independent interpretation of notes and tests performed by another provider:   None.  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 She has noted worsening headaches, but on further questioning she has had nearly daily headaches, low level about 15 days per month, and more recently she had a severe headache that necessitated a dark room, she also had significant nausea. This is all consistent with a classic migraine, without aura. We will start with Topamax, I warned her about the cognitive and neurologic side effects. Increasing Maxalt to 10 mg as needed. She declines brain MRI. I like to see her back in a month and we can check a migraine diary and augment Topamax dose if needed. If she does not tolerate Topamax I am happy to try one of the newer agents such as Aimovig.    ___________________________________________ Ihor Austin. Benjamin Stain, M.D., ABFM., CAQSM. Primary Care and Sports Medicine [facility]  Adjunct Instructor of Family Medicine  [employer]

## 2019-10-14 NOTE — Assessment & Plan Note (Signed)
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 She has noted worsening headaches, but on further questioning she has had nearly daily headaches, low level about 15 days per month, and more recently she had a severe headache that necessitated a dark room, she also had significant nausea. This is all consistent with a classic migraine, without aura. We will start with Topamax, I warned her about the cognitive and neurologic side effects. Increasing Maxalt to 10 mg as needed. She declines brain MRI. I like to see her back in a month and we can check a migraine diary and augment Topamax dose if needed. If she does not tolerate Topamax I am happy to try one of the newer agents such as Aimovig.

## 2019-10-15 MED ORDER — VALACYCLOVIR HCL 1 G PO TABS: 1000.0000 mg | ORAL_TABLET | Freq: Two times a day (BID) | ORAL | 11 refills | Status: DC

## 2019-11-11 ENCOUNTER — Encounter: Payer: Self-pay | Admitting: Sports Medicine

## 2019-11-11 ENCOUNTER — Ambulatory Visit (INDEPENDENT_AMBULATORY_CARE_PROVIDER_SITE_OTHER): Payer: BC Managed Care – PPO | Admitting: Sports Medicine

## 2019-11-11 DIAGNOSIS — G43709 Chronic migraine without aura, not intractable, without status migrainosus: Secondary | ICD-10-CM

## 2019-11-11 NOTE — Progress Notes (Signed)
    Procedures performed today:    None.  Independent interpretation of notes and tests performed by another provider:   None.  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 She had significant nausea and photophobia during her episodes. We started Topamax and she has not had any further headaches. No changes.    ___________________________________________ Ihor Austin. Benjamin Stain, M.D., ABFM., CAQSM. Primary Care and Sports Medicine [facility]  Adjunct Instructor of Family Medicine  [employer]

## 2019-11-11 NOTE — Assessment & Plan Note (Signed)
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 She had significant nausea and photophobia during her episodes. We started Topamax and she has not had any further headaches. No changes.

## 2019-12-01 ENCOUNTER — Other Ambulatory Visit: Payer: Self-pay | Admitting: Sports Medicine

## 2019-12-12 DIAGNOSIS — M26602 Left temporomandibular joint disorder, unspecified: Secondary | ICD-10-CM

## 2019-12-12 MED ORDER — TRAMADOL HCL 50 MG PO TABS: 100.0000 mg | ORAL_TABLET | Freq: Three times a day (TID) | ORAL | 3 refills | Status: DC

## 2019-12-12 NOTE — Telephone Encounter (Signed)
Pended medication refill request

## 2020-02-05 ENCOUNTER — Other Ambulatory Visit: Payer: Self-pay

## 2020-02-05 ENCOUNTER — Encounter: Payer: Self-pay | Admitting: Family Medicine

## 2020-02-05 ENCOUNTER — Ambulatory Visit (INDEPENDENT_AMBULATORY_CARE_PROVIDER_SITE_OTHER): Payer: BC Managed Care – PPO | Admitting: Family Medicine

## 2020-02-05 VITALS — HR 102 | Temp 98.2°F | Wt 118.0 lb

## 2020-02-05 DIAGNOSIS — F418 Other specified anxiety disorders: Secondary | ICD-10-CM

## 2020-02-05 DIAGNOSIS — R5383 Other fatigue: Secondary | ICD-10-CM | POA: Diagnosis not present

## 2020-02-05 LAB — CBC
HCT: 36.9 % (ref 35.0–45.0)
Hemoglobin: 12.7 g/dL (ref 11.7–15.5)
MCH: 31.1 pg (ref 27.0–33.0)
MCHC: 34.4 g/dL (ref 32.0–36.0)
MCV: 90.4 fL (ref 80.0–100.0)
MPV: 9.9 fL (ref 7.5–12.5)
Platelets: 289 10*3/uL (ref 140–400)
RBC: 4.08 10*6/uL (ref 3.80–5.10)
RDW: 12.4 % (ref 11.0–15.0)
WBC: 8.1 10*3/uL (ref 3.8–10.8)

## 2020-02-05 LAB — COMPLETE METABOLIC PANEL WITH GFR
AG Ratio: 2.1 (calc) (ref 1.0–2.5)
ALT: 6 U/L (ref 6–29)
AST: 10 U/L (ref 10–30)
Albumin: 4.7 g/dL (ref 3.6–5.1)
Alkaline phosphatase (APISO): 30 U/L — ABNORMAL LOW (ref 31–125)
BUN/Creatinine Ratio: 15 (calc) (ref 6–22)
BUN: 7 mg/dL (ref 7–25)
CO2: 23 mmol/L (ref 20–32)
Calcium: 9.2 mg/dL (ref 8.6–10.2)
Chloride: 107 mmol/L (ref 98–110)
Creat: 0.47 mg/dL — ABNORMAL LOW (ref 0.50–1.10)
GFR, Est African American: 140 mL/min/{1.73_m2} (ref >=60)
GFR, Est Non African American: 121 mL/min/{1.73_m2} (ref >=60)
Globulin: 2.2 g/dL (calc) (ref 1.9–3.7)
Glucose, Bld: 90 mg/dL (ref 65–139)
Potassium: 3.4 mmol/L — ABNORMAL LOW (ref 3.5–5.3)
Sodium: 140 mmol/L (ref 135–146)
Total Bilirubin: 0.4 mg/dL (ref 0.2–1.2)
Total Protein: 6.9 g/dL (ref 6.1–8.1)

## 2020-02-05 LAB — TSH: TSH: 1.14 mIU/L

## 2020-02-05 MED ORDER — HYDROXYZINE PAMOATE 25 MG PO CAPS: 25.0000 mg | ORAL_CAPSULE | Freq: Three times a day (TID) | ORAL | 0 refills | Status: DC | PRN

## 2020-02-05 MED ORDER — ESCITALOPRAM OXALATE 10 MG PO TABS: ORAL_TABLET | ORAL | 1 refills | Status: DC

## 2020-02-05 NOTE — Patient Instructions (Signed)

## 2020-02-05 NOTE — Progress Notes (Signed)
April Nielsen - 44 y.o. female MRN [id number]  Date of birth: 02-12-1976  Subjective Chief Complaint  Patient presents with  . Fatigue    HPI April Nielsen is a 44 y.o. female here today to discuss anxiety.  She reports increased feelings of anxiety with some depressive symptoms over the past couple of weeks.  She does have a history of depression and anxiety.  Prescribed effexor previously but did not tolerate well and didn't have much improvement.  Current symptoms include decreased appetite, fatigue, increased headaches, and poor sleep.  She has noticed that symptoms are more severe around her menstrual period. Unable to pinpoint any particular stressor that is contributing to current symptoms.   Depression screen PHQ 2/9 02/05/2020  Decreased Interest 3  Down, Depressed, Hopeless 3  PHQ - 2 Score 6  Altered sleeping 3  Tired, decreased energy 3  Change in appetite 3  Feeling bad or failure about yourself  0  Trouble concentrating 3  Moving slowly or fidgety/restless 0  Suicidal thoughts 0  PHQ-9 Score 18  Difficult doing work/chores Very difficult   GAD 7 : Generalized Anxiety Score 02/05/2020  Nervous, Anxious, on Edge 2  Control/stop worrying 2  Worry too much - different things 2  Trouble relaxing 2  Restless 0  Easily annoyed or irritable 1  Afraid - awful might happen 1  Total GAD 7 Score 10  Anxiety Difficulty Very difficult    ROS:  A comprehensive ROS was completed and negative except as noted per HPI  No Known Allergies  Past Medical History:  Diagnosis Date  . Abnormal Pap smear of cervix 2010  . Bronchitis   . GERD (gastroesophageal reflux disease)   . Herpes   . Stomach ulcer     Past Surgical History:  Procedure Laterality Date  . c-sections     x3  . CESAREAN SECTION     x 3  . CHOLECYSTECTOMY    . TUBAL LIGATION    . WISDOM TOOTH EXTRACTION      Social History   Socioeconomic History  . Marital status: Single    Spouse name: Not on  file  . Number of children: Not on file  . Years of education: Not on file  . Highest education level: Not on file  Occupational History  . Occupation: Charity fundraiser  Tobacco Use  . Smoking status: Former Smoker    Packs/day: 1.00    Years: 3.00    Pack years: 3.00    Types: Cigarettes    Quit date: 06/20/2009    Years since quitting: 10.6  . Smokeless tobacco: Never Used  . Tobacco comment: tobacco use- no   Vaping Use  . Vaping Use: Never used  Substance and Sexual Activity  . Alcohol use: No    Alcohol/week: 0.0 standard drinks  . Drug use: No  . Sexual activity: Yes    Partners: Male    Birth control/protection: Surgical  Other Topics Concern  . Not on file  Social History Narrative  . Not on file   Social Determinants of Health   Financial Resource Strain:   . Difficulty of Paying Living Expenses:   Food Insecurity:   . Worried About Programme researcher, broadcasting/film/video in the Last Year:   . Barista in the Last Year:   Transportation Needs:   . Freight forwarder (Medical):   Marland Kitchen Lack of Transportation (Non-Medical):   Physical Activity:   . Days of Exercise  per Week:   . Minutes of Exercise per Session:   Stress:   . Feeling of Stress :   Social Connections:   . Frequency of Communication with Friends and Family:   . Frequency of Social Gatherings with Friends and Family:   . Attends Religious Services:   . Active Member of Clubs or Organizations:   . Attends Banker Meetings:   Marland Kitchen Marital Status:     Family History  Problem Relation Age of Onset  . Hypertension Father   . Hyperlipidemia Father   . Hyperlipidemia Mother   . Cancer Maternal Grandmother        cervical  . Cancer Maternal Grandfather        Throat  . Diabetes Paternal Grandmother   . Heart attack Paternal Grandmother   . Cancer Paternal Grandfather        Leukemia    Health Maintenance  Topic Date Due  . PAP SMEAR-Modifier  04/25/2019  . TETANUS/TDAP  06/21/2019  . INFLUENZA VACCINE   01/19/2020  . COVID-19 Vaccine  Completed  . Hepatitis C Screening  Completed  . HIV Screening  Completed     ----------------------------------------------------------------------------------------------------------------------------------------------------------------------------------------------------------------- Physical Exam Pulse (!) 102   Temp 98.2 F (36.8 C) (Oral)   Wt 118 lb (53.5 kg)   LMP 01/25/2020 (Approximate)   SpO2 100% Comment: on RA  BMI 20.25 kg/m   Physical Exam Constitutional:      Appearance: Normal appearance.  HENT:     Head: Normocephalic and atraumatic.  Neurological:     General: No focal deficit present.     Mental Status: She is alert.  Psychiatric:        Mood and Affect: Mood is anxious. Affect is tearful.        Speech: Speech normal.        Behavior: Behavior normal.        Thought Content: Thought content normal.        Cognition and Memory: Cognition and memory normal.     ------------------------------------------------------------------------------------------------------------------------------------------------------------------------------------------------------------------- Assessment and Plan  Depression with anxiety Elevated PHQ and GAD scores.  Discussed medications to help with management of anxiety and depression.  Start lexapro, 5mg  x1 week then increase to 10mg .  Hydroxyzine 25mg  as needed.  Encouraged seeing a .    Meds ordered this encounter  Medications  . escitalopram (LEXAPRO) 10 MG tablet    Sig: Start 5mg  daily x1 week then increase to 10mg  daily    Dispense:  30 tablet    Refill:  1  . hydrOXYzine (VISTARIL) 25 MG capsule    Sig: Take 1 capsule (25 mg total) by mouth every 8 (eight) hours as needed for anxiety.    Dispense:  30 capsule    Refill:  0    Return in about 4 weeks (around 03/04/2020) for anxiety.    This visit occurred during the SARS-CoV-2 public health emergency.  Safety  protocols were in place, including screening questions prior to the visit, additional usage of staff PPE, and extensive cleaning of exam room while observing appropriate contact time as indicated for disinfecting solutions.

## 2020-02-05 NOTE — Assessment & Plan Note (Signed)
Elevated PHQ and GAD scores.  Discussed medications to help with management of anxiety and depression.  Start lexapro, 5mg  x1 week then increase to 10mg .  Hydroxyzine 25mg  as needed.  Encouraged seeing a .

## 2020-02-07 ENCOUNTER — Other Ambulatory Visit: Payer: Self-pay | Admitting: Sports Medicine

## 2020-02-17 ENCOUNTER — Other Ambulatory Visit: Payer: Self-pay | Admitting: Sports Medicine

## 2020-02-17 DIAGNOSIS — G43709 Chronic migraine without aura, not intractable, without status migrainosus: Secondary | ICD-10-CM

## 2020-02-21 ENCOUNTER — Encounter: Payer: Self-pay | Admitting: Family Medicine

## 2020-03-03 ENCOUNTER — Encounter: Payer: Self-pay | Admitting: Sports Medicine

## 2020-03-03 ENCOUNTER — Ambulatory Visit (INDEPENDENT_AMBULATORY_CARE_PROVIDER_SITE_OTHER): Payer: BC Managed Care – PPO | Admitting: Sports Medicine

## 2020-03-03 DIAGNOSIS — F419 Anxiety disorder, unspecified: Secondary | ICD-10-CM | POA: Diagnosis not present

## 2020-03-03 DIAGNOSIS — K219 Gastro-esophageal reflux disease without esophagitis: Secondary | ICD-10-CM | POA: Diagnosis not present

## 2020-03-03 DIAGNOSIS — G8929 Other chronic pain: Secondary | ICD-10-CM

## 2020-03-03 DIAGNOSIS — M546 Pain in thoracic spine: Secondary | ICD-10-CM

## 2020-03-03 MED ORDER — CALCIUM CARBONATE-VITAMIN D 600-400 MG-UNIT PO TABS: 1.0000 | ORAL_TABLET | Freq: Two times a day (BID) | ORAL | 11 refills | Status: AC

## 2020-03-03 MED ORDER — FAMOTIDINE 40 MG PO TABS: 40.0000 mg | ORAL_TABLET | Freq: Two times a day (BID) | ORAL | 11 refills | Status: DC

## 2020-03-03 MED ORDER — HYDROXYZINE PAMOATE 25 MG PO CAPS: 25.0000 mg | ORAL_CAPSULE | Freq: Three times a day (TID) | ORAL | 0 refills | Status: DC | PRN
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
## 2020-03-03 NOTE — Assessment & Plan Note (Signed)
Increasing throat clearing, reflux symptoms, over-the-counter famotidine ineffective. Increasing famotidine dose to the maximum.
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
## 2020-03-03 NOTE — Assessment & Plan Note (Signed)
April Nielsen has had thoracic back pain for some time now, 6 years ago we did trigger point injections that seem to help, her current medications are helpful as well. 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

## 2020-03-04 MED ORDER — ESCITALOPRAM OXALATE 10 MG PO TABS
ORAL_TABLET | ORAL | 1 refills | Status: DC
Start: 2020-03-04 — End: 2020-04-01

## 2020-03-17 MED ORDER — HYDROXYZINE PAMOATE 50 MG PO CAPS: 50.0000 mg | ORAL_CAPSULE | Freq: Three times a day (TID) | ORAL | 3 refills | Status: DC | PRN

## 2020-03-20 ENCOUNTER — Other Ambulatory Visit: Payer: Self-pay | Admitting: Sports Medicine

## 2020-04-01 ENCOUNTER — Other Ambulatory Visit: Payer: Self-pay | Admitting: Family Medicine

## 2020-04-02 DIAGNOSIS — M26602 Left temporomandibular joint disorder, unspecified: Secondary | ICD-10-CM

## 2020-04-02 MED ORDER — TRAMADOL HCL 50 MG PO TABS: 100.0000 mg | ORAL_TABLET | Freq: Three times a day (TID) | ORAL | 3 refills | Status: DC

## 2020-04-02 MED ORDER — HYDROXYZINE PAMOATE 50 MG PO CAPS: 50.0000 mg | ORAL_CAPSULE | Freq: Three times a day (TID) | ORAL | 3 refills | Status: DC | PRN

## 2020-04-14 ENCOUNTER — Ambulatory Visit (INDEPENDENT_AMBULATORY_CARE_PROVIDER_SITE_OTHER): Payer: BC Managed Care – PPO | Admitting: Sports Medicine

## 2020-04-14 ENCOUNTER — Encounter: Payer: Self-pay | Admitting: Sports Medicine

## 2020-04-14 ENCOUNTER — Other Ambulatory Visit: Payer: Self-pay

## 2020-04-14 DIAGNOSIS — F419 Anxiety disorder, unspecified: Secondary | ICD-10-CM | POA: Diagnosis not present

## 2020-04-14 DIAGNOSIS — M546 Pain in thoracic spine: Secondary | ICD-10-CM

## 2020-04-14 DIAGNOSIS — G8929 Other chronic pain: Secondary | ICD-10-CM | POA: Diagnosis not present

## 2020-04-14 MED ORDER — ESCITALOPRAM OXALATE 20 MG PO TABS: ORAL_TABLET | ORAL | 3 refills | Status: DC

## 2020-04-14 NOTE — Assessment & Plan Note (Signed)
Overall having some improvements, PHQ gad scores have improved considerably, increasing Lexapro to 20 mg daily, hydroxyzine 50 is also effective. Return to see me in 6 weeks for repeat PHQ/GAD.

## 2020-04-14 NOTE — Assessment & Plan Note (Signed)
Thoracic back pain long-term. 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 Lyn Hollingshead for osteopathic manipulation. We will do all of this before considering advanced imaging and repeat trigger injections.

## 2020-04-14 NOTE — Progress Notes (Signed)
    Procedures performed today:    None.  Independent interpretation of notes and tests performed by another provider:   None.  Brief History, Exam, Impression, and Recommendations:    Anxiety and depression Overall having some improvements, PHQ gad scores have improved considerably, increasing Lexapro to 20 mg daily, hydroxyzine 50 is also effective. Return to see me in 6 weeks for repeat PHQ/GAD.  Thoracic back pain Thoracic back pain long-term. 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 Lyn Hollingshead for osteopathic manipulation. We will do all of this before considering advanced imaging and repeat trigger injections.    ___________________________________________ Ihor Austin. Benjamin Stain, M.D., ABFM., CAQSM. Primary Care and Sports Medicine [place] [facility]  Adjunct Instructor of Family Medicine  [employer]

## 2020-04-21 ENCOUNTER — Other Ambulatory Visit: Payer: Self-pay

## 2020-04-21 ENCOUNTER — Ambulatory Visit (INDEPENDENT_AMBULATORY_CARE_PROVIDER_SITE_OTHER): Payer: BC Managed Care – PPO | Admitting: Osteopathic Medicine

## 2020-04-21 ENCOUNTER — Encounter: Payer: Self-pay | Admitting: Osteopathic Medicine

## 2020-04-21 VITALS — BP 117/80 | HR 81 | Temp 98.2°F | Wt 123.0 lb

## 2020-04-21 DIAGNOSIS — G8929 Other chronic pain: Secondary | ICD-10-CM | POA: Diagnosis not present

## 2020-04-21 DIAGNOSIS — M9901 Segmental and somatic dysfunction of cervical region: Secondary | ICD-10-CM

## 2020-04-21 DIAGNOSIS — M546 Pain in thoracic spine: Secondary | ICD-10-CM | POA: Diagnosis not present

## 2020-04-21 DIAGNOSIS — M6289 Other specified disorders of muscle: Secondary | ICD-10-CM

## 2020-04-21 NOTE — Progress Notes (Signed)
April Nielsen is a 44 y.o. female who presents to  [facility] Primary Care & Sports Medicine at [facility]  today, 04/21/20, seeking care for the following:  . Back pain      ASSESSMENT & PLAN with other pertinent findings:  The primary encounter diagnosis was Somatic dysfunction of spine, cervical. Diagnoses of Chronic bilateral thoracic back pain and Muscle tightness were also pertinent to this visit.   No results found for this or any previous visit (from the past 24 hour(s)).   OMT applied to cervical, thoracic spine, trapezius and rhomboid muscle groups bilaterally: FPR, MFR, HVLA   (+)patient relief noted w/ MFR anf FPR  RTC prn       Follow-up instructions: Return for repeat OMT treatments as neede / as desired.                                         BP 117/80 (BP Location: Right Arm, Patient Position: Sitting, Cuff Size: Normal)   Pulse 81   Temp 98.2 F (36.8 C) (Oral)   Wt 123 lb 0.6 oz (55.8 kg)   BMI 21.12 kg/m   Current Meds  Medication Sig  . acetaminophen (TYLENOL) 500 MG tablet Take 500 mg by mouth.  . Calcium Carbonate-Vitamin D 600-400 MG-UNIT tablet Take 1 tablet by mouth 2 (two) times daily.  Marland Kitchen escitalopram (LEXAPRO) 20 MG tablet TAKE 1 TABLET DAILY  . famotidine (PEPCID) 40 MG tablet Take 1 tablet (40 mg total) by mouth 2 (two) times daily.  Marland Kitchen gabapentin (NEURONTIN) 300 MG capsule TAKE ONE CAPSULE BY MOUTH THREE TIMES A DAY  . hydrOXYzine (VISTARIL) 50 MG capsule Take 1 capsule (50 mg total) by mouth every 8 (eight) hours as needed for anxiety.  . ondansetron (ZOFRAN-ODT) 8 MG disintegrating tablet Take 1 tablet (8 mg total) by mouth every 8 (eight) hours as needed for nausea.  . rizatriptan (MAXALT-MLT) 10 MG disintegrating tablet DISSOLVE ONE TABLET BY MOUTH AT ONSET OF HEADACHE; MAY REPEAT ONCE IN 2 HOURS IF NEEDED  . topiramate (TOPAMAX) 50 MG tablet TAKE ONE HALF TABLET BY MOUTH AT NIGHT FOR  1 WEEK THEN 1 TABLET AT NIGHT  . traMADol (ULTRAM) 50 MG tablet Take 2 tablets (100 mg total) by mouth 3 (three) times daily.  . valACYclovir (VALTREX) 1000 MG tablet Take 1 tablet (1,000 mg total) by mouth 2 (two) times daily.    No results found for this or any previous visit (from the past 72 hour(s)).  No results found.     All questions at time of visit were answered - patient instructed to contact office with any additional concerns or updates.  ER/RTC precautions were reviewed with the patient as applicable.   Please note: voice recognition software was used to produce this document, and typos may escape review. Please contact Dr. Lyn Hollingshead for any needed clarifications.

## 2020-05-12 ENCOUNTER — Encounter: Payer: Self-pay | Admitting: Osteopathic Medicine

## 2020-05-12 ENCOUNTER — Ambulatory Visit (INDEPENDENT_AMBULATORY_CARE_PROVIDER_SITE_OTHER): Payer: BC Managed Care – PPO | Admitting: Osteopathic Medicine

## 2020-05-12 VITALS — BP 109/75 | HR 81 | Temp 98.4°F | Wt 124.1 lb

## 2020-05-12 DIAGNOSIS — M9901 Segmental and somatic dysfunction of cervical region: Secondary | ICD-10-CM | POA: Diagnosis not present

## 2020-05-12 DIAGNOSIS — M6289 Other specified disorders of muscle: Secondary | ICD-10-CM

## 2020-05-12 DIAGNOSIS — G43709 Chronic migraine without aura, not intractable, without status migrainosus: Secondary | ICD-10-CM | POA: Diagnosis not present

## 2020-05-12 MED ORDER — AMBULATORY NON FORMULARY MEDICATION: 1 refills | Status: DC

## 2020-05-12 NOTE — Progress Notes (Signed)
April Nielsen is a 44 y.o. female who presents to  [facility] Primary Care & Sports Medicine at [facility]  today, 05/12/20, seeking care for the following:  . Back pain - better . Neck pain - about same, OMT last time helped somewhat      ASSESSMENT & PLAN with other pertinent findings:  The encounter diagnosis was Somatic dysfunction of spine, cervical.      OMT applied to cervical, trapezius muscle groups bilaterally: FPR, MFR  (+)patient relief noted w/ MFR anf FPR  RTC prn       Follow-up instructions: Return for repeat OMT treatments as needed .                                         BP 109/75 (BP Location: Left Arm, Patient Position: Sitting, Cuff Size: Normal)   Pulse 81   Temp 98.4 F (36.9 C) (Oral)   Wt 124 lb 1.3 oz (56.3 kg)   BMI 21.30 kg/m   Current Meds  Medication Sig  . acetaminophen (TYLENOL) 500 MG tablet Take 500 mg by mouth.  . Calcium Carbonate-Vitamin D 600-400 MG-UNIT tablet Take 1 tablet by mouth 2 (two) times daily.  Marland Kitchen escitalopram (LEXAPRO) 20 MG tablet TAKE 1 TABLET DAILY  . famotidine (PEPCID) 40 MG tablet Take 1 tablet (40 mg total) by mouth 2 (two) times daily.  Marland Kitchen gabapentin (NEURONTIN) 300 MG capsule TAKE ONE CAPSULE BY MOUTH THREE TIMES A DAY  . hydrOXYzine (VISTARIL) 50 MG capsule Take 1 capsule (50 mg total) by mouth every 8 (eight) hours as needed for anxiety.  . ondansetron (ZOFRAN-ODT) 8 MG disintegrating tablet Take 1 tablet (8 mg total) by mouth every 8 (eight) hours as needed for nausea.  . rizatriptan (MAXALT-MLT) 10 MG disintegrating tablet DISSOLVE ONE TABLET BY MOUTH AT ONSET OF HEADACHE; MAY REPEAT ONCE IN 2 HOURS IF NEEDED  . topiramate (TOPAMAX) 50 MG tablet TAKE ONE HALF TABLET BY MOUTH AT NIGHT FOR 1 WEEK THEN 1 TABLET AT NIGHT  . traMADol (ULTRAM) 50 MG tablet Take 2 tablets (100 mg total) by mouth 3 (three) times daily.  . valACYclovir (VALTREX) 1000 MG tablet  Take 1 tablet (1,000 mg total) by mouth 2 (two) times daily.    No results found for this or any previous visit (from the past 72 hour(s)).  No results found.     All questions at time of visit were answered - patient instructed to contact office with any additional concerns or updates.  ER/RTC precautions were reviewed with the patient as applicable.   Please note: voice recognition software was used to produce this document, and typos may escape review. Please contact Dr. Lyn Hollingshead for any needed clarifications.

## 2020-05-26 ENCOUNTER — Ambulatory Visit (INDEPENDENT_AMBULATORY_CARE_PROVIDER_SITE_OTHER): Payer: BC Managed Care – PPO | Admitting: Sports Medicine

## 2020-05-26 ENCOUNTER — Other Ambulatory Visit: Payer: Self-pay

## 2020-05-26 DIAGNOSIS — G8929 Other chronic pain: Secondary | ICD-10-CM | POA: Diagnosis not present

## 2020-05-26 DIAGNOSIS — M546 Pain in thoracic spine: Secondary | ICD-10-CM | POA: Diagnosis not present

## 2020-05-26 DIAGNOSIS — F419 Anxiety disorder, unspecified: Secondary | ICD-10-CM

## 2020-05-26 NOTE — Progress Notes (Signed)
    Procedures performed today:    None.  Independent interpretation of notes and tests performed by another provider:   None.  Brief History, Exam, Impression, and Recommendations:    Anxiety and depression Well-controlled on Lexapro 20, hydroxyzine 50. Return as needed.  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 Lyn Hollingshead. Return as needed for this.    ___________________________________________ Ihor Austin. Benjamin Stain, M.D., ABFM., CAQSM. Primary Care and Sports Medicine [place] [facility]  Adjunct Instructor of Family Medicine  [employer]

## 2020-05-26 NOTE — Assessment & Plan Note (Signed)
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 Lyn Hollingshead. Return as needed for this.

## 2020-05-26 NOTE — Assessment & Plan Note (Signed)
Well-controlled on Lexapro 20, hydroxyzine 50. Return as needed.

## 2020-06-13 ENCOUNTER — Other Ambulatory Visit: Payer: Self-pay | Admitting: Sports Medicine

## 2020-06-13 DIAGNOSIS — G43709 Chronic migraine without aura, not intractable, without status migrainosus: Secondary | ICD-10-CM

## 2020-07-01 ENCOUNTER — Other Ambulatory Visit: Payer: Self-pay | Admitting: Sports Medicine

## 2020-07-27 DIAGNOSIS — N920 Excessive and frequent menstruation with regular cycle: Secondary | ICD-10-CM | POA: Diagnosis not present

## 2020-07-27 DIAGNOSIS — R102 Pelvic and perineal pain: Secondary | ICD-10-CM | POA: Diagnosis not present

## 2020-07-27 DIAGNOSIS — N946 Dysmenorrhea, unspecified: Secondary | ICD-10-CM | POA: Diagnosis not present

## 2020-07-27 DIAGNOSIS — R1031 Right lower quadrant pain: Secondary | ICD-10-CM | POA: Diagnosis not present

## 2020-08-03 ENCOUNTER — Encounter: Payer: Self-pay | Admitting: Sports Medicine

## 2020-08-03 ENCOUNTER — Ambulatory Visit (INDEPENDENT_AMBULATORY_CARE_PROVIDER_SITE_OTHER): Payer: BC Managed Care – PPO | Admitting: Sports Medicine

## 2020-08-03 ENCOUNTER — Other Ambulatory Visit: Payer: Self-pay

## 2020-08-03 DIAGNOSIS — M26602 Left temporomandibular joint disorder, unspecified: Secondary | ICD-10-CM

## 2020-08-03 DIAGNOSIS — N946 Dysmenorrhea, unspecified: Secondary | ICD-10-CM

## 2020-08-03 DIAGNOSIS — K219 Gastro-esophageal reflux disease without esophagitis: Secondary | ICD-10-CM

## 2020-08-03 MED ORDER — PANTOPRAZOLE SODIUM 40 MG PO TBEC: 40.0000 mg | DELAYED_RELEASE_TABLET | Freq: Every day | ORAL | 3 refills | Status: DC

## 2020-08-03 MED ORDER — HYDROXYZINE PAMOATE 50 MG PO CAPS: 50.0000 mg | ORAL_CAPSULE | Freq: Three times a day (TID) | ORAL | 3 refills | Status: DC | PRN

## 2020-08-03 MED ORDER — TRAMADOL HCL 50 MG PO TABS: 100.0000 mg | ORAL_TABLET | Freq: Three times a day (TID) | ORAL | 3 refills | Status: DC

## 2020-08-03 NOTE — Assessment & Plan Note (Signed)
Recently saw her OB/GYN, expected differentials include adenomyosis, she is post C-section, endometriosis, and tubal ligation syndrome. My suspicion is the first 2. Continue ibuprofen as needed, and we could certainly try combination oral contraceptives before considering more advanced evaluation and intervention.

## 2020-08-03 NOTE — Progress Notes (Signed)
    Procedures performed today:    None.  Independent interpretation of notes and tests performed by another provider:   None.  Brief History, Exam, Impression, and Recommendations:    LPRD (laryngopharyngeal reflux disease) Worsening throat clearing, reflux symptoms, famotidine max dose not effective, switching to pantoprazole, discontinue famotidine.  Dysmenorrhea Recently saw her OB/GYN, expected differentials include adenomyosis, she is post C-section, endometriosis, and tubal ligation syndrome. My suspicion is the first 2. Continue ibuprofen as needed, and we could certainly try combination oral contraceptives before considering more advanced evaluation and intervention.    ___________________________________________ Ihor Austin. Benjamin Stain, M.D., ABFM., CAQSM. Primary Care and Sports Medicine [place] [facility]  Adjunct Instructor of Family Medicine  [employer]

## 2020-08-03 NOTE — Assessment & Plan Note (Signed)
Worsening throat clearing, reflux symptoms, famotidine max dose not effective, switching to pantoprazole, discontinue famotidine.

## 2020-08-03 NOTE — Patient Instructions (Signed)
Endometriosis  Endometriosis is a condition in which a tissue similar to the endometrium grows in places outside the uterus. The endometrium is a tissue that forms the lining of the uterus. This tissue can grow in the organs that create the eggs (ovaries), the tubes that carry the eggs to the uterus (fallopian tubes), the vagina, and the bowel. This tissue most often grows on the ovaries and inner lining of the pelvic cavity (peritoneum). When the uterus sheds the endometrium every menstrual cycle, there is bleeding wherever these types of tissue are located. This can cause pain because blood is irritating to tissues that are not normally exposed to it. Endometriosis canalso make it harder for a woman to get pregnant. What are the causes? The cause of this condition is not known. What increases the risk? The following factors may make you more likely to develop this condition: Having a family history of endometriosis. Having never given birth. Starting your period at 10 years of age or younger. What are the signs or symptoms? Often, there are no symptoms of this condition. If you do have symptoms, they may: Vary depending on where the abnormal tissue is growing. Occur during your menstrual period (most often) or at the middle of your cycle. Come and go. You may have no symptoms during some months. Stop when you no longer have your monthly periods (menopause). Symptoms may include: Pain in the area between your hip bones (pelvis). Heavier bleeding during periods. Menstrual periods that happen more than once a month. Pain during sex. Pain in the back or abdomen. Painful bowel movements. Not being able to get pregnant. How is this diagnosed? This condition is diagnosed based on your symptoms and a physical exam. You may have tests, such as: Blood tests and urine tests to help rule out other causes. Ultrasound to look for tissues that are not normal. This is often done over your skin. It is  sometimes done through the vagina (transvaginal). X-ray of the lower bowel (barium enema). CT scan. MRI. To confirm the diagnosis, your health care provider may use a device with a small camera to check tissue inside your abdomen (laparoscopy). Abnormal tissue may be removed and checked in a lab (biopsy). How is this treated? There is no cure for this condition. Treatment focuses on controlling your symptoms. The type of treatment also depends on whether you want to become pregnant in the future. This condition may be treated with: Medicines. These may include: Medicines to relieve pain, including NSAIDs such as ibuprofen. Hormone therapy. This uses artificial hormones to slow the growth of the abnormal tissue. This may include hormonal birth control, such as pills. Surgery to remove the abnormal tissue. During surgery: Tissue may be removed using a laparoscope and a laser (laparoscopic laser treatment). The fallopian tubes, uterus, and ovaries may be removed (hysterectomy). This is done in very severe cases. Follow these instructions at home: Get regular exercise. Limit alcohol use. Eat a balanced diet. Avoid caffeine. Take over-the-counter and prescription medicines only as told by your health care provider. Keep all follow-up visits as told by your health care provider. This is important. Where to find more information American College of Obstetricians and Gynecologists: https://www.acog.org/ Office on Women's Health: https://www.womenshealth.gov/ Contact a health care provider if: You are having new pain or trouble controlling pain. You have problems getting pregnant. You have a fever. Get help right away if you have: Severe pain that does not get better with medicine. Severe nausea and vomiting, or   if you cannot eat or drink without vomiting. Pain that affects your abdomen only on the lower, right side. Pain in your abdomen that gets worse. Swelling in your abdomen. Blood in  your stool (feces). Summary Endometriosis is a condition in which a tissue similar to the endometrium grows in places outside the uterus. The endometrium is a tissue that forms the lining of the uterus. The cause of this condition is not known. This condition may be treated with medicines to relieve pain, hormone therapy, or surgery. If you have this condition, get regular exercise, limit alcohol use, and avoid caffeine. Get help right away if you have severe pain that does not get better with medicine, or if you have severe nausea and vomiting or blood in your stool. This information is not intended to replace advice given to you by your health care provider. Make sure you discuss any questions you have with your healthcare provider. Document Revised: 07/24/2019 Document Reviewed: 07/24/2019 Elsevier Patient Education  2021 [employer].  

## 2020-08-11 ENCOUNTER — Other Ambulatory Visit: Payer: Self-pay | Admitting: Sports Medicine

## 2020-08-11 DIAGNOSIS — F419 Anxiety disorder, unspecified: Secondary | ICD-10-CM

## 2020-08-18 DIAGNOSIS — N946 Dysmenorrhea, unspecified: Secondary | ICD-10-CM | POA: Diagnosis not present

## 2020-08-18 DIAGNOSIS — N920 Excessive and frequent menstruation with regular cycle: Secondary | ICD-10-CM | POA: Diagnosis not present

## 2020-08-18 DIAGNOSIS — R102 Pelvic and perineal pain: Secondary | ICD-10-CM | POA: Diagnosis not present

## 2020-08-18 DIAGNOSIS — Z01812 Encounter for preprocedural laboratory examination: Secondary | ICD-10-CM | POA: Diagnosis not present

## 2020-08-18 DIAGNOSIS — Z124 Encounter for screening for malignant neoplasm of cervix: Secondary | ICD-10-CM | POA: Diagnosis not present

## 2020-08-18 DIAGNOSIS — Z1151 Encounter for screening for human papillomavirus (HPV): Secondary | ICD-10-CM | POA: Diagnosis not present

## 2020-08-20 LAB — RESULTS CONSOLE HPV: CHL HPV: NEGATIVE

## 2020-08-20 LAB — HM PAP SMEAR: HM Pap smear: NEGATIVE

## 2020-08-29 ENCOUNTER — Other Ambulatory Visit: Payer: Self-pay | Admitting: Sports Medicine

## 2020-10-17 ENCOUNTER — Other Ambulatory Visit: Payer: Self-pay | Admitting: Sports Medicine

## 2020-10-17 DIAGNOSIS — G43709 Chronic migraine without aura, not intractable, without status migrainosus: Secondary | ICD-10-CM

## 2020-10-19 DIAGNOSIS — K219 Gastro-esophageal reflux disease without esophagitis: Secondary | ICD-10-CM

## 2020-10-19 MED ORDER — PANTOPRAZOLE SODIUM 40 MG PO TBEC: 40.0000 mg | DELAYED_RELEASE_TABLET | Freq: Two times a day (BID) | ORAL | 11 refills | Status: DC

## 2020-10-23 ENCOUNTER — Other Ambulatory Visit: Payer: Self-pay | Admitting: Sports Medicine

## 2020-10-23 DIAGNOSIS — G43709 Chronic migraine without aura, not intractable, without status migrainosus: Secondary | ICD-10-CM

## 2020-10-23 DIAGNOSIS — R519 Headache, unspecified: Secondary | ICD-10-CM

## 2020-10-27 ENCOUNTER — Other Ambulatory Visit: Payer: Self-pay | Admitting: Sports Medicine

## 2020-11-27 ENCOUNTER — Other Ambulatory Visit: Payer: Self-pay | Admitting: Sports Medicine

## 2020-11-27 DIAGNOSIS — K219 Gastro-esophageal reflux disease without esophagitis: Secondary | ICD-10-CM

## 2020-11-27 DIAGNOSIS — M26602 Left temporomandibular joint disorder, unspecified: Secondary | ICD-10-CM

## 2020-11-27 MED ORDER — TRAMADOL HCL 50 MG PO TABS: 100.0000 mg | ORAL_TABLET | Freq: Three times a day (TID) | ORAL | 3 refills | Status: DC

## 2020-12-04 ENCOUNTER — Ambulatory Visit (INDEPENDENT_AMBULATORY_CARE_PROVIDER_SITE_OTHER): Payer: Self-pay | Admitting: Sports Medicine

## 2020-12-04 ENCOUNTER — Other Ambulatory Visit: Payer: Self-pay

## 2020-12-04 DIAGNOSIS — M542 Cervicalgia: Secondary | ICD-10-CM

## 2020-12-04 MED ORDER — HYDROCODONE-ACETAMINOPHEN 5-325 MG PO TABS: 1.0000 | ORAL_TABLET | Freq: Three times a day (TID) | ORAL | 0 refills | Status: DC | PRN

## 2020-12-04 MED ORDER — METHOCARBAMOL 500 MG PO TABS
500.0000 mg | ORAL_TABLET | Freq: Three times a day (TID) | ORAL | 0 refills | Status: DC
Start: 2020-12-04 — End: 2020-12-31

## 2020-12-04 MED ORDER — PREDNISONE 50 MG PO TABS: ORAL_TABLET | ORAL | 0 refills | Status: DC

## 2020-12-04 NOTE — Progress Notes (Signed)
    Procedures performed today:    None.  Independent interpretation of notes and tests performed by another provider:   None.  Brief History, Exam, Impression, and Recommendations:    Right C6 radiculopathy Known cervical DDD, no current radicular symptoms, historically has been on tramadol which is not working with the current flare, we will do a burst of prednisone, Robaxin, x-rays, home rehab exercises. Short course of hydrocodone. Return to see me in approximately 4 weeks to reevaluate.  Chronic process with exacerbation and pharmacologic intervention    ___________________________________________ Ihor Austin. Benjamin Stain, M.D., ABFM., CAQSM. Primary Care and Sports Medicine  [facility]  Adjunct Instructor of Family Medicine  [employer]

## 2020-12-04 NOTE — Assessment & Plan Note (Addendum)
Known cervical DDD, no current radicular symptoms, historically has been on tramadol which is not working with the current flare, we will do a burst of prednisone, Robaxin, x-rays, home rehab exercises. Short course of hydrocodone. Return to see me in approximately 4 weeks to reevaluate.  Chronic process with exacerbation and pharmacologic intervention

## 2020-12-18 ENCOUNTER — Other Ambulatory Visit: Payer: Self-pay | Admitting: Sports Medicine

## 2020-12-18 DIAGNOSIS — F419 Anxiety disorder, unspecified: Secondary | ICD-10-CM

## 2020-12-25 ENCOUNTER — Other Ambulatory Visit: Payer: Self-pay | Admitting: Sports Medicine

## 2020-12-31 ENCOUNTER — Ambulatory Visit (INDEPENDENT_AMBULATORY_CARE_PROVIDER_SITE_OTHER): Payer: Self-pay

## 2020-12-31 ENCOUNTER — Other Ambulatory Visit: Payer: Self-pay

## 2020-12-31 DIAGNOSIS — M542 Cervicalgia: Secondary | ICD-10-CM

## 2020-12-31 MED ORDER — METHOCARBAMOL 500 MG PO TABS: 500.0000 mg | ORAL_TABLET | Freq: Three times a day (TID) | ORAL | 0 refills | Status: DC

## 2020-12-31 NOTE — Telephone Encounter (Signed)
Meds ordered this encounter  Medications   methocarbamol (ROBAXIN) 500 MG tablet    Sig: Take 1 tablet (500 mg total) by mouth 3 (three) times daily.    Dispense:  60 tablet    Refill:  0   Dr. Karie Schwalbe will have to address the narcotic

## 2021-01-04 MED ORDER — HYDROCODONE-ACETAMINOPHEN 5-325 MG PO TABS: 1.0000 | ORAL_TABLET | Freq: Three times a day (TID) | ORAL | 0 refills | Status: DC | PRN

## 2021-01-04 NOTE — Addendum Note (Signed)
Addended by: Monica Becton on: 01/04/2021 06:52 PM   Modules accepted: Orders

## 2021-02-14 ENCOUNTER — Other Ambulatory Visit: Payer: Self-pay | Admitting: Sports Medicine

## 2021-02-14 DIAGNOSIS — G43709 Chronic migraine without aura, not intractable, without status migrainosus: Secondary | ICD-10-CM

## 2021-02-22 ENCOUNTER — Other Ambulatory Visit: Payer: Self-pay | Admitting: Sports Medicine

## 2021-02-24 ENCOUNTER — Encounter (INDEPENDENT_AMBULATORY_CARE_PROVIDER_SITE_OTHER): Payer: Self-pay

## 2021-02-24 DIAGNOSIS — F419 Anxiety disorder, unspecified: Secondary | ICD-10-CM

## 2021-02-24 MED ORDER — HYDROXYZINE PAMOATE 50 MG PO CAPS: 50.0000 mg | ORAL_CAPSULE | Freq: Three times a day (TID) | ORAL | 3 refills | Status: DC | PRN

## 2021-02-24 NOTE — Telephone Encounter (Signed)
I spent 5 total minutes of online digital evaluation and management services. 

## 2021-02-26 IMAGING — MR MRI CERVICAL SPINE WITHOUT CONTRAST
5 series · 34 of 48 positions shown · non-contrast
Comparison: MRI of the cervical spine 03/13/2012

CLINICAL DATA: Neck pain. Bilateral arm pain into the fingers.
Study indication states right cervical radiculopathy. Neck pain on
the right side.

EXAM:
MRI CERVICAL SPINE WITHOUT CONTRAST
TECHNIQUE: Multiplanar, multisequence MR imaging of the cervical spine was
performed. No intravenous contrast was administered.

[Series 5: T2 · axial · 3.0mm · 0.62mm/px · z∈[-122,-25]mm · 9 of 27 slices shown (1 of 2)]
[im 1/27]
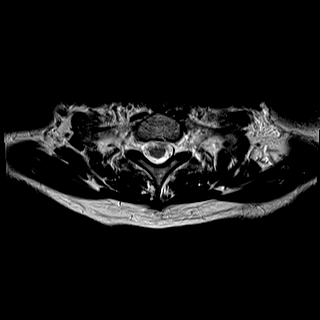
[im 5/27]
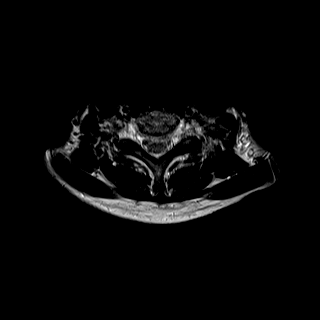
[im 9/27]
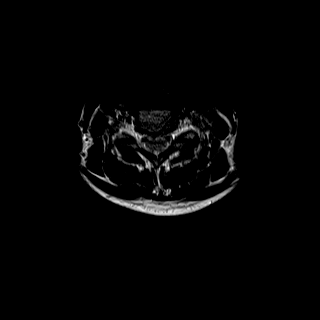
[im 11/27]
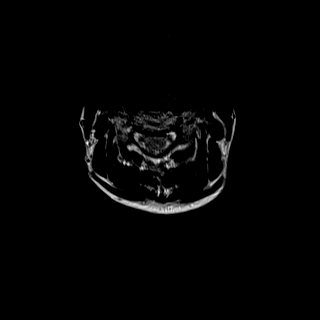
[im 14/27]
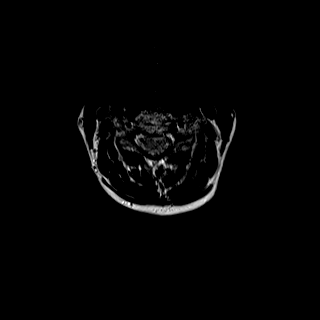
[im 16/27]
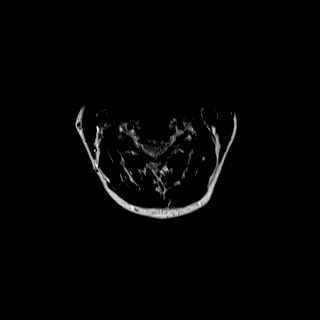
[im 18/27]
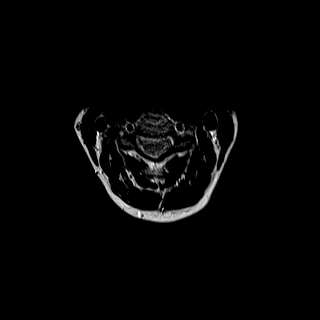
[im 22/27]
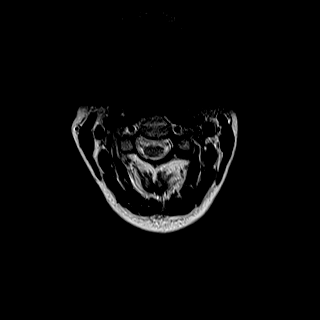
[im 27/27]
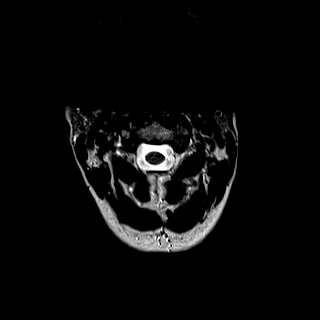

[Series 6: mpgr ax · axial · 3.0mm · 0.35mm/px · z∈[-112,-67]mm · 4 of 27 slices shown]
[im 1/27]
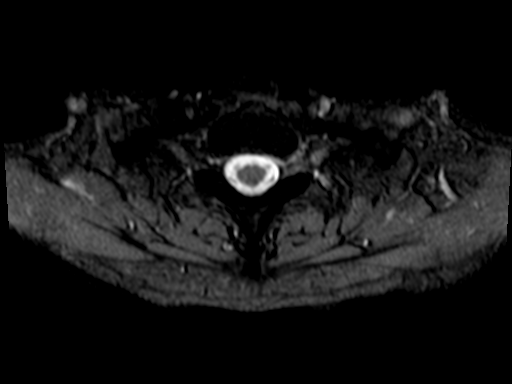
[im 5/27]
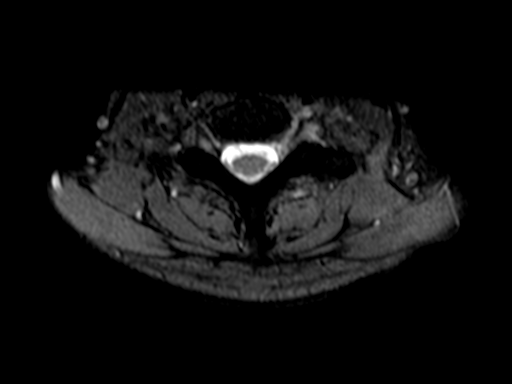
[im 9/27]
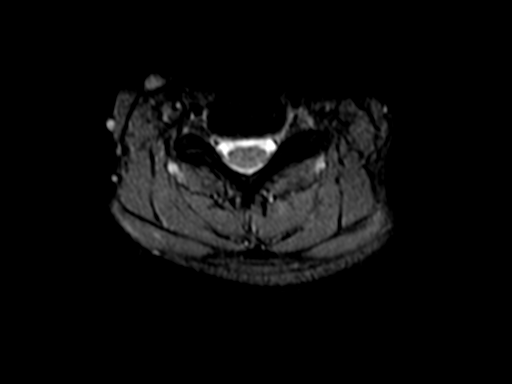
[im 13/27]
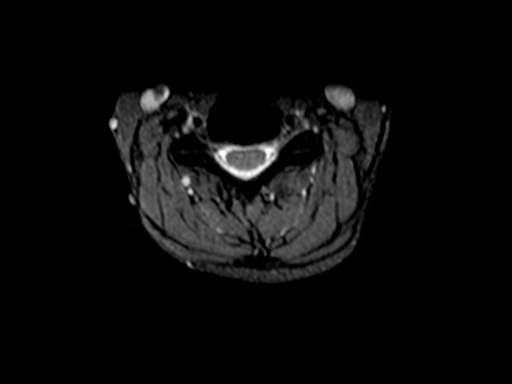

[Series 7: T2 · sagittal · 3.0mm · 0.56mm/px · 7 of 13 slices shown (2 of 2)]
[im 1/13]
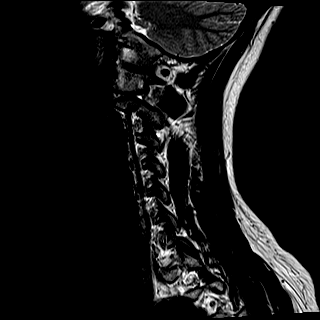
[im 3/13]
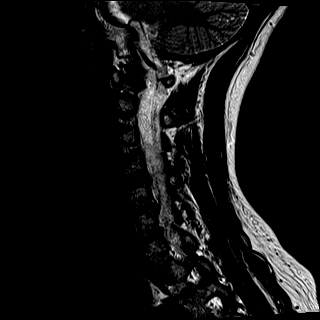
[im 5/13]
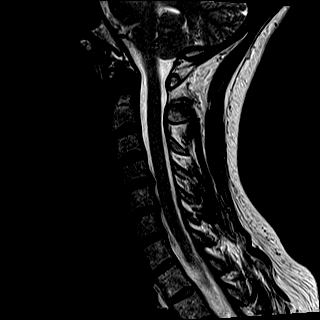
[im 7/13]
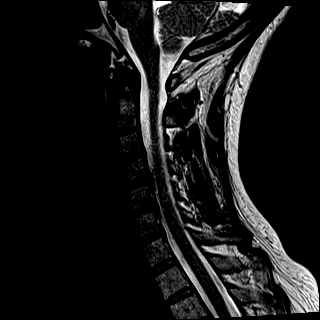
[im 9/13]
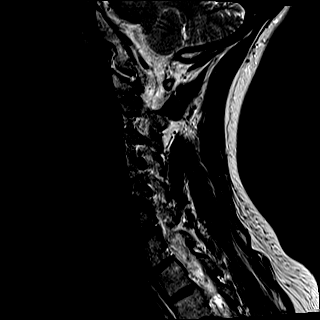
[im 11/13]
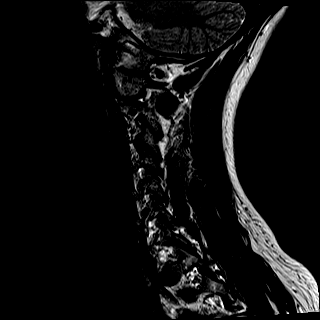
[im 13/13]
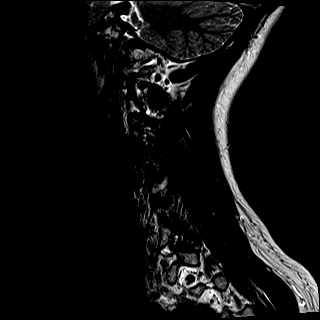

[Series 8: STIR · sagittal · 3.0mm · 0.35mm/px · 7 of 13 slices shown]
[im 1/13]
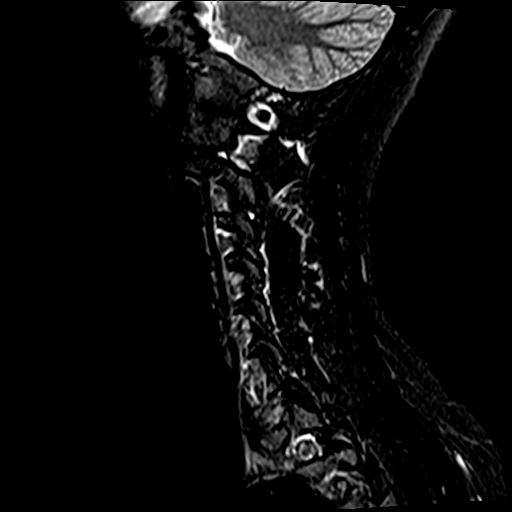
[im 3/13]
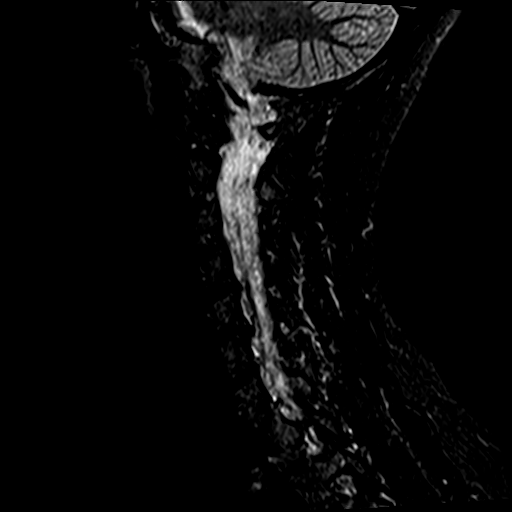
[im 5/13]
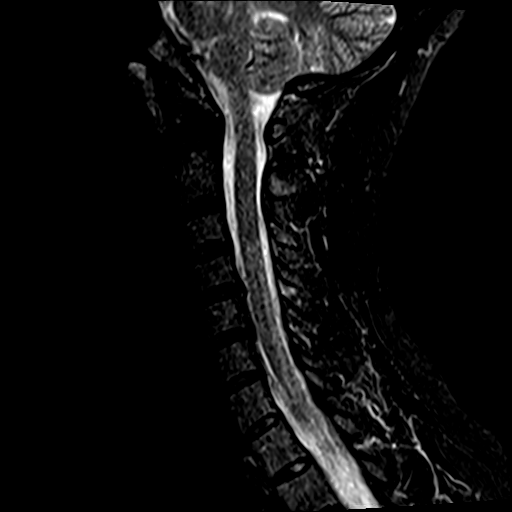
[im 7/13]
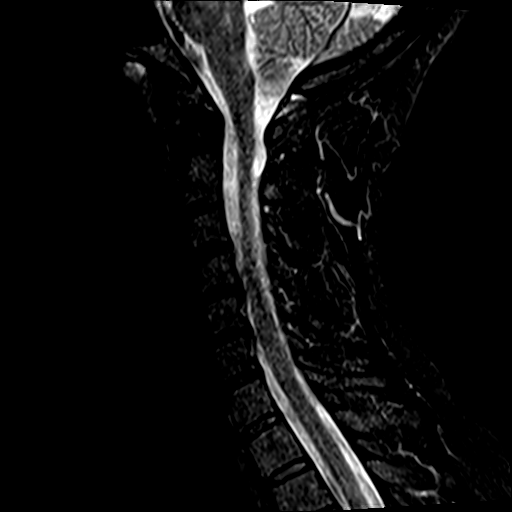
[im 9/13]
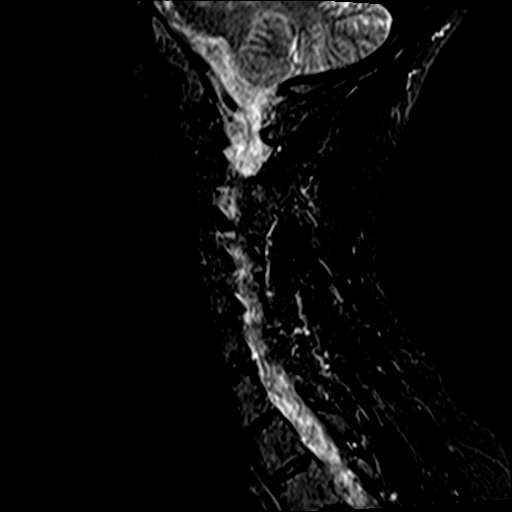
[im 11/13]
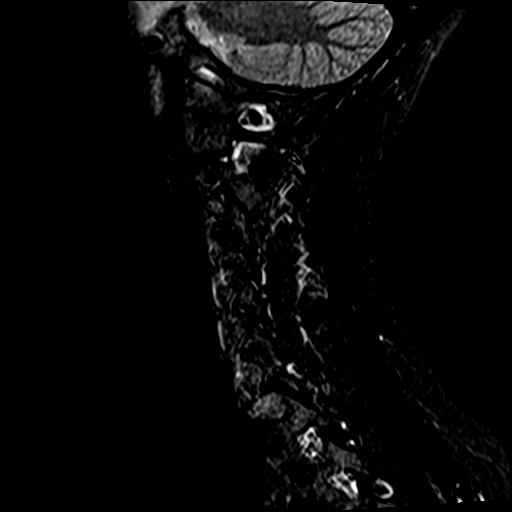
[im 13/13]
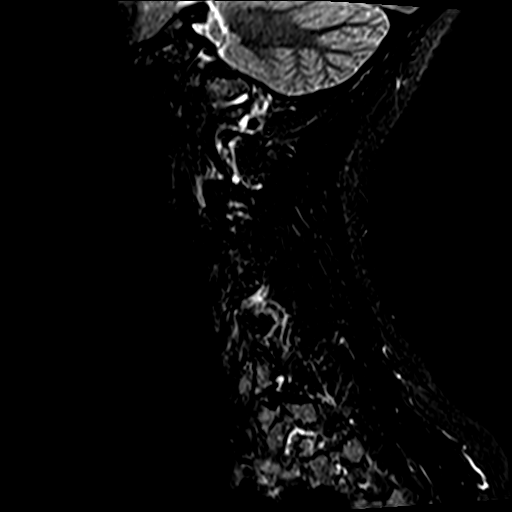

[Series 9: T1 · sagittal · 3.0mm · 0.70mm/px · 7 of 13 slices shown]
[im 1/13]
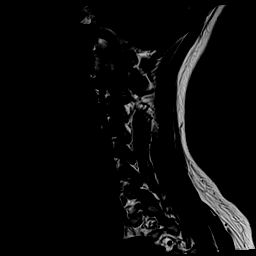
[im 3/13]
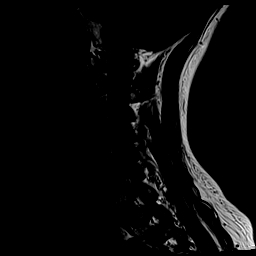
[im 5/13]
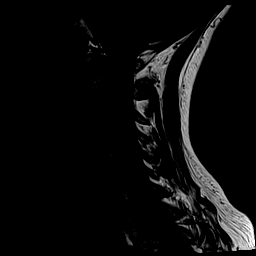
[im 7/13]
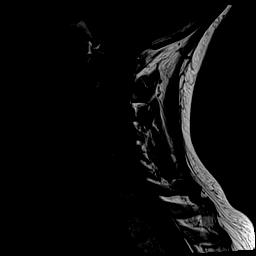
[im 9/13]
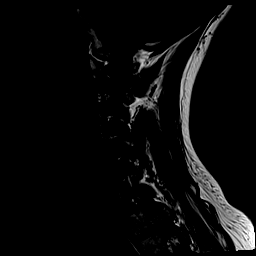
[im 11/13]
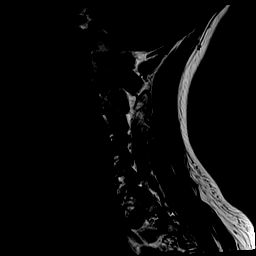
[im 13/13]
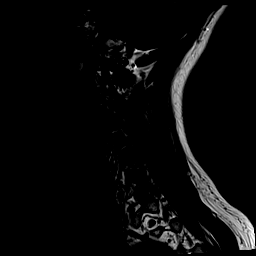

[34 of 48 positions shown; findings below may reference images not displayed]

FINDINGS: Alignment: AP alignment is anatomic.

Vertebrae: Marrow signal and vertebral body heights are normal.

Cord: Normal signal is present in the cervical and upper thoracic
spinal cord to the lowest imaged level, T1-2.

Posterior Fossa, vertebral arteries, paraspinal tissues: Flow is
present in the vertebral arteries bilaterally. T2 signal changes are
present within the pons. Craniocervical junction is otherwise
normal.

Disc levels:

C2-3: Negative.

C3-4: Asymmetric left-sided uncovertebral spurring is present
without significant stenosis.

C4-5: A central soft disc protrusion is slightly more prominent than
on the prior study. This effaces the ventral CSF and potentially
contacts the cord. Foramina are patent bilaterally.

C5-6: Mild rightward disc osteophyte complex is stable. No
significant stenosis is present.

C6-7: Negative.

C7-T1: Negative.
IMPRESSION: 1. Slight progression of central soft disc protrusion at C4-5 with
effacement of the ventral CSF. Foramina are patent.
2. Stable rightward broad-based disc osteophyte complex without
significant stenosis.

## 2021-03-21 ENCOUNTER — Other Ambulatory Visit: Payer: Self-pay | Admitting: Sports Medicine

## 2021-03-29 ENCOUNTER — Other Ambulatory Visit: Payer: Self-pay | Admitting: Family Medicine

## 2021-03-29 DIAGNOSIS — M542 Cervicalgia: Secondary | ICD-10-CM

## 2021-03-29 DIAGNOSIS — M26602 Left temporomandibular joint disorder, unspecified: Secondary | ICD-10-CM

## 2021-03-29 MED ORDER — TRAMADOL HCL 50 MG PO TABS: 100.0000 mg | ORAL_TABLET | Freq: Three times a day (TID) | ORAL | 3 refills | Status: DC

## 2021-03-29 MED ORDER — METHOCARBAMOL 500 MG PO TABS
500.0000 mg | ORAL_TABLET | Freq: Three times a day (TID) | ORAL | 0 refills | Status: DC
Start: 2021-03-29 — End: 2021-05-19

## 2021-04-19 ENCOUNTER — Other Ambulatory Visit: Payer: Self-pay | Admitting: Sports Medicine

## 2021-04-19 DIAGNOSIS — F419 Anxiety disorder, unspecified: Secondary | ICD-10-CM

## 2021-04-28 MED ORDER — HYDROXYZINE PAMOATE 100 MG PO CAPS: 100.0000 mg | ORAL_CAPSULE | Freq: Three times a day (TID) | ORAL | 3 refills | Status: DC | PRN

## 2021-05-04 ENCOUNTER — Other Ambulatory Visit: Payer: Self-pay

## 2021-05-04 ENCOUNTER — Ambulatory Visit (INDEPENDENT_AMBULATORY_CARE_PROVIDER_SITE_OTHER): Payer: No Typology Code available for payment source | Admitting: Sports Medicine

## 2021-05-04 DIAGNOSIS — F419 Anxiety disorder, unspecified: Secondary | ICD-10-CM

## 2021-05-04 DIAGNOSIS — M26602 Left temporomandibular joint disorder, unspecified: Secondary | ICD-10-CM

## 2021-05-04 MED ORDER — TRAMADOL HCL 50 MG PO TABS: 100.0000 mg | ORAL_TABLET | Freq: Three times a day (TID) | ORAL | 3 refills | Status: DC

## 2021-05-04 MED ORDER — BUSPIRONE HCL 5 MG PO TABS: 5.0000 mg | ORAL_TABLET | Freq: Three times a day (TID) | ORAL | 3 refills | Status: DC

## 2021-05-04 NOTE — Progress Notes (Addendum)
    Procedures performed today:    None.  Independent interpretation of notes and tests performed by another provider:   None.  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 More recently having an increase in anxiety and depressive symptoms, going through separation. We will add BuSpar 5 mg 3 times daily for a month followed by 10 milligrams 3 times daily if needed a month from now. Continue with behavioral therapy.  After the visit I did note one of the questions on her PHQ-9 was positive, for thoughts of being better off dead or thoughts of hurting herself, I did call her, she discussed that she had these thoughts and they were somewhat invasive but she would never carry them out because of what it would do to her daughter and her mother, she contracts for safety and agrees to call me or 911 if she develops worsening of these thoughts or a plan.    ___________________________________________ Ihor Austin. Benjamin Stain, M.D., ABFM., CAQSM. Primary Care and Sports Medicine [place] [facility]  Adjunct Instructor of Family Medicine  [employer]

## 2021-05-04 NOTE — Assessment & Plan Note (Addendum)
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 More recently having an increase in anxiety and depressive symptoms, going through separation. We will add BuSpar 5 mg 3 times daily for a month followed by 10 milligrams 3 times daily if needed a month from now. Continue with behavioral therapy.  After the visit I did note one of the questions on her PHQ-9 was positive, for thoughts of being better off dead or thoughts of hurting herself, I did call her, she discussed that she had these thoughts and they were somewhat invasive but she would never carry them out because of what it would do to her daughter and her mother, she contracts for safety and agrees to call me or 911 if she develops worsening of these thoughts or a plan.

## 2021-05-16 ENCOUNTER — Other Ambulatory Visit: Payer: Self-pay | Admitting: Sports Medicine

## 2021-05-19 ENCOUNTER — Encounter: Payer: Self-pay | Admitting: Sports Medicine

## 2021-05-19 DIAGNOSIS — M542 Cervicalgia: Secondary | ICD-10-CM

## 2021-05-19 MED ORDER — METHOCARBAMOL 500 MG PO TABS: 500.0000 mg | ORAL_TABLET | Freq: Three times a day (TID) | ORAL | 0 refills | Status: DC

## 2021-06-01 ENCOUNTER — Ambulatory Visit: Payer: No Typology Code available for payment source | Admitting: Sports Medicine

## 2021-06-04 ENCOUNTER — Ambulatory Visit: Payer: No Typology Code available for payment source | Admitting: Sports Medicine

## 2021-06-04 ENCOUNTER — Other Ambulatory Visit: Payer: Self-pay

## 2021-06-04 DIAGNOSIS — F419 Anxiety disorder, unspecified: Secondary | ICD-10-CM

## 2021-06-04 MED ORDER — HYDROXYZINE PAMOATE 100 MG PO CAPS
100.0000 mg | ORAL_CAPSULE | Freq: Three times a day (TID) | ORAL | 3 refills | Status: DC | PRN
  Filled 2022-02-07: qty 270, 90d supply, fill #0
  Filled 2022-05-17 – 2022-05-18 (×2): qty 90, 30d supply, fill #1

## 2021-06-04 MED ORDER — BUSPIRONE HCL 10 MG PO TABS
10.0000 mg | ORAL_TABLET | Freq: Three times a day (TID) | ORAL | 11 refills | Status: DC
  Filled 2022-02-07: qty 90, 30d supply, fill #0
  Filled 2022-03-21: qty 90, 30d supply, fill #1
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
## 2021-06-09 ENCOUNTER — Other Ambulatory Visit: Payer: Self-pay | Admitting: Sports Medicine

## 2021-06-09 DIAGNOSIS — F419 Anxiety disorder, unspecified: Secondary | ICD-10-CM

## 2021-06-14 ENCOUNTER — Other Ambulatory Visit: Payer: Self-pay | Admitting: Sports Medicine

## 2021-07-01 ENCOUNTER — Ambulatory Visit: Payer: PRIVATE HEALTH INSURANCE | Admitting: Sports Medicine

## 2021-07-01 ENCOUNTER — Other Ambulatory Visit: Payer: Self-pay

## 2021-07-01 ENCOUNTER — Encounter: Payer: Self-pay | Admitting: Sports Medicine

## 2021-07-01 DIAGNOSIS — M25561 Pain in right knee: Secondary | ICD-10-CM | POA: Diagnosis not present

## 2021-07-01 DIAGNOSIS — G8929 Other chronic pain: Secondary | ICD-10-CM | POA: Diagnosis not present

## 2021-07-01 DIAGNOSIS — F419 Anxiety disorder, unspecified: Secondary | ICD-10-CM | POA: Diagnosis not present
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
## 2021-07-01 NOTE — Assessment & Plan Note (Signed)
Chronic pain right medial knee, no pain with terminal flexion, ligamentous structures stable, no effusion. Mild gelling in the history. Pain is predominately at the medial joint line, suspect mild early osteoarthritis. She can do over-the-counter NSAIDs, adding knee conditioning, if insufficient improvement after 4 to 6 weeks we will consider x-rays and injection.
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
## 2021-07-07 ENCOUNTER — Encounter: Payer: Self-pay | Admitting: Sports Medicine

## 2021-07-17 ENCOUNTER — Other Ambulatory Visit: Payer: Self-pay | Admitting: Sports Medicine

## 2021-07-20 ENCOUNTER — Telehealth: Payer: Self-pay | Admitting: Sports Medicine

## 2021-07-20 ENCOUNTER — Encounter: Payer: Self-pay | Admitting: Sports Medicine

## 2021-07-20 NOTE — Telephone Encounter (Signed)
Paperwork filled out, she can come pick it up now.

## 2021-07-20 NOTE — Telephone Encounter (Signed)
Patient dropped off Medical Leave paperwork, patient stated PCP missed question #9. Would like paperwork to be faxed after question #9 is filled out. Patient stated she would like a call after paperwork is complete & will pick up. Paperwork placed in providers box (Dr. Karie Schwalbe). -lmr

## 2021-07-20 NOTE — Telephone Encounter (Signed)
Spoke with patient. She is aware that her paperwork is completed and ready for pick up from the front desk.

## 2021-07-30 ENCOUNTER — Encounter: Payer: Self-pay | Admitting: Sports Medicine

## 2021-07-30 DIAGNOSIS — U071 COVID-19: Secondary | ICD-10-CM | POA: Insufficient documentation

## 2021-07-30 MED ORDER — HYDROCODONE BIT-HOMATROP MBR 5-1.5 MG/5ML PO SOLN: 5.0000 mL | Freq: Three times a day (TID) | ORAL | 0 refills | Status: DC | PRN

## 2021-07-30 NOTE — Assessment & Plan Note (Signed)
Doing well with the exception of postinfectious cough and rhinorrhea, adding Hycodan.

## 2021-08-20 ENCOUNTER — Other Ambulatory Visit: Payer: Self-pay | Admitting: Sports Medicine

## 2021-08-20 DIAGNOSIS — F419 Anxiety disorder, unspecified: Secondary | ICD-10-CM

## 2021-08-30 ENCOUNTER — Encounter: Payer: Self-pay | Admitting: Sports Medicine

## 2021-08-30 DIAGNOSIS — M26602 Left temporomandibular joint disorder, unspecified: Secondary | ICD-10-CM

## 2021-08-30 MED ORDER — TRAMADOL HCL 50 MG PO TABS: 100.0000 mg | ORAL_TABLET | Freq: Three times a day (TID) | ORAL | 3 refills | Status: DC

## 2021-08-30 NOTE — Telephone Encounter (Signed)
Omeprazole is not listed on pt's current medication list.  Please review refill requests.  Tiajuana Amass, CMA ?

## 2021-10-19 ENCOUNTER — Other Ambulatory Visit: Payer: Self-pay | Admitting: Sports Medicine

## 2021-11-03 ENCOUNTER — Other Ambulatory Visit: Payer: Self-pay | Admitting: Sports Medicine

## 2021-11-03 DIAGNOSIS — G43709 Chronic migraine without aura, not intractable, without status migrainosus: Secondary | ICD-10-CM

## 2021-11-03 DIAGNOSIS — R519 Headache, unspecified: Secondary | ICD-10-CM

## 2021-11-20 ENCOUNTER — Other Ambulatory Visit: Payer: Self-pay | Admitting: Sports Medicine

## 2021-11-20 DIAGNOSIS — K219 Gastro-esophageal reflux disease without esophagitis: Secondary | ICD-10-CM

## 2021-12-21 ENCOUNTER — Other Ambulatory Visit: Payer: Self-pay | Admitting: Sports Medicine

## 2021-12-21 DIAGNOSIS — K219 Gastro-esophageal reflux disease without esophagitis: Secondary | ICD-10-CM

## 2021-12-21 DIAGNOSIS — F419 Anxiety disorder, unspecified: Secondary | ICD-10-CM

## 2021-12-23 ENCOUNTER — Encounter: Payer: Self-pay | Admitting: Sports Medicine

## 2021-12-23 ENCOUNTER — Ambulatory Visit (INDEPENDENT_AMBULATORY_CARE_PROVIDER_SITE_OTHER): Payer: PRIVATE HEALTH INSURANCE | Admitting: Sports Medicine

## 2021-12-23 VITALS — BP 123/74 | HR 96 | Ht 64.0 in | Wt 115.0 lb

## 2021-12-23 DIAGNOSIS — Z0000 Encounter for general adult medical examination without abnormal findings: Secondary | ICD-10-CM | POA: Diagnosis not present

## 2021-12-23 DIAGNOSIS — Z23 Encounter for immunization: Secondary | ICD-10-CM | POA: Diagnosis not present

## 2021-12-23 DIAGNOSIS — E782 Mixed hyperlipidemia: Secondary | ICD-10-CM

## 2021-12-23 DIAGNOSIS — K219 Gastro-esophageal reflux disease without esophagitis: Secondary | ICD-10-CM

## 2021-12-23 DIAGNOSIS — N939 Abnormal uterine and vaginal bleeding, unspecified: Secondary | ICD-10-CM

## 2021-12-23 DIAGNOSIS — Z1231 Encounter for screening mammogram for malignant neoplasm of breast: Secondary | ICD-10-CM

## 2021-12-23 DIAGNOSIS — Z1211 Encounter for screening for malignant neoplasm of colon: Secondary | ICD-10-CM | POA: Diagnosis not present

## 2021-12-23 DIAGNOSIS — D649 Anemia, unspecified: Secondary | ICD-10-CM

## 2021-12-23 MED ORDER — PANTOPRAZOLE SODIUM 40 MG PO TBEC
40.0000 mg | DELAYED_RELEASE_TABLET | Freq: Two times a day (BID) | ORAL | 3 refills | Status: DC
  Filled 2022-01-19 – 2022-02-07 (×2): qty 180, 90d supply, fill #0
  Filled 2022-05-17: qty 60, 30d supply, fill #1
  Filled 2022-06-21: qty 60, 30d supply, fill #2
  Filled 2022-07-20 – 2022-07-21 (×2): qty 60, 30d supply, fill #3

## 2021-12-23 NOTE — Progress Notes (Signed)
Subjective:    CC: Annual Physical Exam  HPI:  This patient is here for their annual physical  I reviewed the past medical history, family history, social history, surgical history, and allergies today and no changes were needed.  Please see the problem list section below in epic for further details.  Past Medical History: Past Medical History:  Diagnosis Date   Abnormal Pap smear of cervix 2010   Bronchitis    GERD (gastroesophageal reflux disease)    Herpes    Stomach ulcer    Past Surgical History: Past Surgical History:  Procedure Laterality Date   c-sections     x3   CESAREAN SECTION     x 3   CHOLECYSTECTOMY     TUBAL LIGATION     WISDOM TOOTH EXTRACTION     Social History: Social History   Socioeconomic History   Marital status: Single    Spouse name: Not on file   Number of children: Not on file   Years of education: Not on file   Highest education level: Not on file  Occupational History   Occupation: RN  Tobacco Use   Smoking status: Former    Packs/day: 1.00    Years: 3.00    Total pack years: 3.00    Types: Cigarettes    Quit date: 06/20/2009    Years since quitting: 12.5   Smokeless tobacco: Never   Tobacco comments:    tobacco use- no   Vaping Use   Vaping Use: Never used  Substance and Sexual Activity   Alcohol use: No    Alcohol/week: 0.0 standard drinks of alcohol   Drug use: No   Sexual activity: Yes    Partners: Male    Birth control/protection: Surgical  Other Topics Concern   Not on file  Social History Narrative   Not on file   Social Determinants of Health   Financial Resource Strain: Not on file  Food Insecurity: Not on file  Transportation Needs: Not on file  Physical Activity: Not on file  Stress: Not on file  Social Connections: Not on file   Family History: Family History  Problem Relation Age of Onset   Hypertension Father    Hyperlipidemia Father    Hyperlipidemia Mother    Cancer Maternal Grandmother         cervical   Cancer Maternal Grandfather        Throat   Diabetes Paternal Grandmother    Heart attack Paternal Grandmother    Cancer Paternal Grandfather        Leukemia   Allergies: No Known Allergies Medications: See med rec.  Review of Systems: No headache, visual changes, nausea, vomiting, diarrhea, constipation, dizziness, abdominal pain, skin rash, fevers, chills, night sweats, swollen lymph nodes, weight loss, chest pain, body aches, joint swelling, muscle aches, shortness of breath, mood changes, visual or auditory hallucinations.  Objective:    General: Well Developed, well nourished, and in no acute distress.  Neuro: Alert and oriented x3, extra-ocular muscles intact, sensation grossly intact. Cranial nerves II through XII are intact, motor, sensory, and coordinative functions are all intact. HEENT: Normocephalic, atraumatic, pupils equal round reactive to light, neck supple, no masses, no lymphadenopathy, thyroid nonpalpable. Oropharynx, nasopharynx, external ear canals are unremarkable. Skin: Warm and dry, no rashes noted.  Cardiac: Regular rate and rhythm, no murmurs rubs or gallops.  Respiratory: Clear to auscultation bilaterally. Not using accessory muscles, speaking in full sentences.  Abdominal: Soft, nontender, nondistended, positive bowel  sounds, no masses, no organomegaly.  Musculoskeletal: Shoulder, elbow, wrist, hip, knee, ankle stable, and with full range of motion.  Impression and Recommendations:    The patient was counselled, risk factors were discussed, anticipatory guidance given.  Annual physical exam Annual physical as above. Due for Tdap, mammogram. Colon cancer screening. Up-to-date on cervical cancer screening. Routine labs today.   Abnormal uterine bleeding Menometrorrhagia, we did discuss birth control, she does smoke cigarettes occasionally so we will hold off for now.  LPRD (laryngopharyngeal reflux disease) Refilling pantoprazole, will do  this for 6 weeks before considering referral for upper endoscopy. No signs or symptoms of GI bleed.   ____________________________________________ Ihor Austin. Benjamin Stain, M.D., ABFM., CAQSM., AME. Primary Care and Sports Medicine [place] [facility]  Adjunct Professor of Family Medicine  [employer]  Restaurant manager, fast food

## 2021-12-23 NOTE — Assessment & Plan Note (Signed)
Refilling pantoprazole, will do this for 6 weeks before considering referral for upper endoscopy. No signs or symptoms of GI bleed.

## 2021-12-23 NOTE — Assessment & Plan Note (Signed)
Annual physical as above. Due for Tdap, mammogram. Colon cancer screening. Up-to-date on cervical cancer screening. Routine labs today.

## 2021-12-23 NOTE — Addendum Note (Signed)
Addended by: Carolin Coy on: 12/23/2021 10:54 AM   Modules accepted: Orders

## 2021-12-23 NOTE — Assessment & Plan Note (Signed)
Menometrorrhagia, we did discuss birth control, she does smoke cigarettes occasionally so we will hold off for now.

## 2021-12-25 ENCOUNTER — Other Ambulatory Visit: Payer: Self-pay | Admitting: Sports Medicine

## 2021-12-25 DIAGNOSIS — M26602 Left temporomandibular joint disorder, unspecified: Secondary | ICD-10-CM

## 2021-12-27 ENCOUNTER — Encounter: Payer: Self-pay | Admitting: Sports Medicine

## 2021-12-27 DIAGNOSIS — F419 Anxiety disorder, unspecified: Secondary | ICD-10-CM

## 2021-12-27 DIAGNOSIS — M542 Cervicalgia: Secondary | ICD-10-CM

## 2021-12-27 MED ORDER — METHOCARBAMOL 500 MG PO TABS: 500.0000 mg | ORAL_TABLET | Freq: Three times a day (TID) | ORAL | 11 refills | Status: DC

## 2021-12-27 MED ORDER — ESCITALOPRAM OXALATE 20 MG PO TABS: 20.0000 mg | ORAL_TABLET | Freq: Every day | ORAL | 3 refills | Status: DC

## 2022-01-11 ENCOUNTER — Other Ambulatory Visit: Payer: Self-pay | Admitting: Sports Medicine

## 2022-01-11 ENCOUNTER — Other Ambulatory Visit (HOSPITAL_BASED_OUTPATIENT_CLINIC_OR_DEPARTMENT_OTHER): Payer: Self-pay

## 2022-01-11 MED ORDER — PANTOPRAZOLE SODIUM 40 MG PO TBEC
40.0000 mg | DELAYED_RELEASE_TABLET | Freq: Every day | ORAL | 3 refills | Status: DC
  Filled 2022-01-19: qty 90, 90d supply, fill #0

## 2022-01-11 MED ORDER — ESCITALOPRAM OXALATE 20 MG PO TABS: 20.0000 mg | ORAL_TABLET | Freq: Every day | ORAL | 3 refills | Status: DC

## 2022-01-11 MED ORDER — GABAPENTIN 300 MG PO CAPS
300.0000 mg | ORAL_CAPSULE | Freq: Three times a day (TID) | ORAL | 2 refills | Status: DC
  Filled 2022-01-11 – 2022-01-13 (×2): qty 90, 30d supply, fill #0

## 2022-01-12 ENCOUNTER — Other Ambulatory Visit: Payer: Self-pay | Admitting: Sports Medicine

## 2022-01-12 ENCOUNTER — Other Ambulatory Visit (HOSPITAL_BASED_OUTPATIENT_CLINIC_OR_DEPARTMENT_OTHER): Payer: Self-pay

## 2022-01-12 DIAGNOSIS — G43709 Chronic migraine without aura, not intractable, without status migrainosus: Secondary | ICD-10-CM

## 2022-01-12 MED ORDER — TOPIRAMATE 50 MG PO TABS
50.0000 mg | ORAL_TABLET | Freq: Every day | ORAL | 3 refills | Status: DC
  Filled 2022-01-12 – 2022-01-13 (×2): qty 90, 90d supply, fill #0

## 2022-01-13 ENCOUNTER — Other Ambulatory Visit (HOSPITAL_COMMUNITY): Payer: Self-pay

## 2022-01-13 ENCOUNTER — Other Ambulatory Visit (HOSPITAL_BASED_OUTPATIENT_CLINIC_OR_DEPARTMENT_OTHER): Payer: Self-pay

## 2022-01-14 ENCOUNTER — Other Ambulatory Visit: Payer: Self-pay | Admitting: Sports Medicine

## 2022-01-14 ENCOUNTER — Other Ambulatory Visit (HOSPITAL_COMMUNITY): Payer: Self-pay

## 2022-01-14 ENCOUNTER — Encounter: Payer: Self-pay | Admitting: Sports Medicine

## 2022-01-14 DIAGNOSIS — M542 Cervicalgia: Secondary | ICD-10-CM

## 2022-01-14 LAB — COMPLETE METABOLIC PANEL WITH GFR
AG Ratio: 2 (calc) (ref 1.0–2.5)
ALT: 7 U/L (ref 6–29)
AST: 12 U/L (ref 10–35)
Albumin: 4.6 g/dL (ref 3.6–5.1)
Alkaline phosphatase (APISO): 31 U/L (ref 31–125)
BUN: 10 mg/dL (ref 7–25)
CO2: 25 mmol/L (ref 20–32)
Calcium: 9.1 mg/dL (ref 8.6–10.2)
Chloride: 105 mmol/L (ref 98–110)
Creat: 0.51 mg/dL (ref 0.50–0.99)
Globulin: 2.3 g/dL (calc) (ref 1.9–3.7)
Glucose, Bld: 80 mg/dL (ref 65–99)
Potassium: 3.6 mmol/L (ref 3.5–5.3)
Sodium: 140 mmol/L (ref 135–146)
Total Bilirubin: 0.3 mg/dL (ref 0.2–1.2)
Total Protein: 6.9 g/dL (ref 6.1–8.1)
eGFR: 117 mL/min/{1.73_m2} (ref >=60)

## 2022-01-14 LAB — HEMOGLOBIN A1C
Hgb A1c MFr Bld: 5 % of total Hgb (ref <5.7)
Mean Plasma Glucose: 97 mg/dL
eAG (mmol/L): 5.4 mmol/L

## 2022-01-14 LAB — LIPID PANEL
Cholesterol: 227 mg/dL — ABNORMAL HIGH (ref <200)
HDL: 70 mg/dL (ref >=50)
LDL Cholesterol (Calc): 139 mg/dL (calc) — ABNORMAL HIGH
Non-HDL Cholesterol (Calc): 157 mg/dL (calc) — ABNORMAL HIGH (ref <130)
Total CHOL/HDL Ratio: 3.2 (calc) (ref <5.0)
Triglycerides: 79 mg/dL (ref <150)

## 2022-01-14 LAB — CBC
HCT: 29 % — ABNORMAL LOW (ref 35.0–45.0)
Hemoglobin: 8.9 g/dL — ABNORMAL LOW (ref 11.7–15.5)
MCH: 23.2 pg — ABNORMAL LOW (ref 27.0–33.0)
MCHC: 30.7 g/dL — ABNORMAL LOW (ref 32.0–36.0)
MCV: 75.7 fL — ABNORMAL LOW (ref 80.0–100.0)
MPV: 10.1 fL (ref 7.5–12.5)
Platelets: 234 Thousand/uL (ref 140–400)
RBC: 3.83 Million/uL (ref 3.80–5.10)
RDW: 16.6 % — ABNORMAL HIGH (ref 11.0–15.0)
WBC: 5.4 Thousand/uL (ref 3.8–10.8)

## 2022-01-14 LAB — B12 AND FOLATE PANEL
Folate: 21.8 ng/mL
Vitamin B-12: 288 pg/mL (ref 200–1100)

## 2022-01-14 LAB — IRON,TIBC AND FERRITIN PANEL
%SAT: 3 % (calc) — ABNORMAL LOW (ref 16–45)
Ferritin: 4 ng/mL — ABNORMAL LOW (ref 16–232)
Iron: 16 ug/dL — ABNORMAL LOW (ref 40–190)
TIBC: 509 mcg/dL (calc) — ABNORMAL HIGH (ref 250–450)

## 2022-01-14 LAB — TSH: TSH: 2.27 mIU/L

## 2022-01-14 MED ORDER — ESCITALOPRAM OXALATE 20 MG PO TABS
20.0000 mg | ORAL_TABLET | Freq: Every day | ORAL | 3 refills | Status: DC
  Filled 2022-01-14: qty 90, 90d supply, fill #0
  Filled 2022-04-18: qty 30, 30d supply, fill #1
  Filled 2022-05-17: qty 30, 30d supply, fill #2
  Filled 2022-06-27: qty 30, 30d supply, fill #3
  Filled 2022-07-20 – 2022-07-21 (×2): qty 30, 30d supply, fill #4

## 2022-01-14 MED ORDER — METHOCARBAMOL 500 MG PO TABS
500.0000 mg | ORAL_TABLET | Freq: Three times a day (TID) | ORAL | 11 refills | Status: DC
  Filled 2022-01-14: qty 90, 30d supply, fill #0
  Filled 2022-02-13: qty 90, 30d supply, fill #1
  Filled 2022-03-21: qty 90, 30d supply, fill #2
  Filled 2022-04-18: qty 90, 30d supply, fill #3
  Filled 2022-05-17 – 2022-05-18 (×2): qty 90, 30d supply, fill #4
  Filled 2022-07-08: qty 90, 30d supply, fill #5

## 2022-01-19 ENCOUNTER — Other Ambulatory Visit (HOSPITAL_BASED_OUTPATIENT_CLINIC_OR_DEPARTMENT_OTHER): Payer: Self-pay

## 2022-01-19 ENCOUNTER — Other Ambulatory Visit (HOSPITAL_COMMUNITY): Payer: Self-pay

## 2022-01-19 LAB — COLOGUARD: COLOGUARD: NEGATIVE

## 2022-01-19 MED FILL — Rizatriptan Benzoate Oral Disintegrating Tab 10 MG (Base Eq): ORAL | 30 days supply | Qty: 10 | Fill #0 | Status: CN

## 2022-01-19 MED FILL — Rizatriptan Benzoate Oral Disintegrating Tab 10 MG (Base Eq): ORAL | 30 days supply | Qty: 10 | Fill #0 | Status: AC

## 2022-01-24 ENCOUNTER — Other Ambulatory Visit (HOSPITAL_COMMUNITY): Payer: Self-pay

## 2022-01-25 ENCOUNTER — Encounter: Payer: Self-pay | Admitting: Sports Medicine

## 2022-01-25 ENCOUNTER — Other Ambulatory Visit (HOSPITAL_COMMUNITY): Payer: Self-pay

## 2022-01-25 DIAGNOSIS — M26602 Left temporomandibular joint disorder, unspecified: Secondary | ICD-10-CM

## 2022-01-25 MED ORDER — TRAMADOL HCL 50 MG PO TABS
100.0000 mg | ORAL_TABLET | Freq: Three times a day (TID) | ORAL | 0 refills | Status: DC | PRN
  Filled 2022-01-25: qty 180, 30d supply, fill #0

## 2022-02-06 ENCOUNTER — Other Ambulatory Visit: Payer: Self-pay | Admitting: Sports Medicine

## 2022-02-06 DIAGNOSIS — G43709 Chronic migraine without aura, not intractable, without status migrainosus: Secondary | ICD-10-CM

## 2022-02-07 ENCOUNTER — Other Ambulatory Visit (HOSPITAL_COMMUNITY): Payer: Self-pay

## 2022-02-08 ENCOUNTER — Other Ambulatory Visit (HOSPITAL_COMMUNITY): Payer: Self-pay

## 2022-02-10 ENCOUNTER — Encounter: Payer: Self-pay | Admitting: Sports Medicine

## 2022-02-10 ENCOUNTER — Ambulatory Visit: Payer: PRIVATE HEALTH INSURANCE | Admitting: Sports Medicine

## 2022-02-10 DIAGNOSIS — D509 Iron deficiency anemia, unspecified: Secondary | ICD-10-CM | POA: Insufficient documentation

## 2022-02-10 DIAGNOSIS — F419 Anxiety disorder, unspecified: Secondary | ICD-10-CM | POA: Diagnosis not present

## 2022-02-10 DIAGNOSIS — D500 Iron deficiency anemia secondary to blood loss (chronic): Secondary | ICD-10-CM | POA: Diagnosis not present

## 2022-02-10 DIAGNOSIS — N939 Abnormal uterine and vaginal bleeding, unspecified: Secondary | ICD-10-CM | POA: Diagnosis not present

## 2022-02-10 MED ORDER — NORGESTIMATE-ETH ESTRADIOL 0.25-35 MG-MCG PO TABS: 1.0000 | ORAL_TABLET | Freq: Every day | ORAL | 11 refills | Status: AC

## 2022-02-10 MED ORDER — GABAPENTIN 300 MG PO CAPS: ORAL_CAPSULE | ORAL | 11 refills | Status: DC

## 2022-02-10 NOTE — Progress Notes (Signed)
    Procedures performed today:    None.  Independent interpretation of notes and tests performed by another provider:   None.  Brief History, Exam, Impression, and Recommendations:    Iron deficiency anemia Kess has iron deficiency anemia, she has been doing oral iron. Still has significant symptoms, she denies melena and hematochezia but she does have metromenorrhagia. She is followed by OB/GYN and they are suggesting hysterectomy. We will recheck her iron indices today and if they continue to be significantly low I would suggest an iron infusion. I would also like her to do some stool cards at home and bring them back, she was somewhat resistant to doing upper endoscopy.  Abnormal uterine bleeding Menometrorrhagia, she actually does not smoke cigarettes as mentioned prior, so we will start birth control.  Anxiety and depression Continue current anxiety medication dosing, she can do gabapentin and we are increasing the dose, she tells me gabapentin does seem to help anxiety as well so I am happy for her to take an additional gabapentin through the day.    ____________________________________________ Ihor Austin. Benjamin Stain, M.D., ABFM., CAQSM., AME. Primary Care and Sports Medicine [place] [facility]  Adjunct Professor of Family Medicine  [place] [employer] [facility] [employer]  Restaurant manager, fast food

## 2022-02-10 NOTE — Assessment & Plan Note (Signed)
Menometrorrhagia, she actually does not smoke cigarettes as mentioned prior, so we will start birth control.

## 2022-02-10 NOTE — Assessment & Plan Note (Addendum)
April Nielsen has iron deficiency anemia, she has been doing oral iron. Still has significant symptoms, she denies melena and hematochezia but she does have metromenorrhagia. She is followed by OB/GYN and they are suggesting hysterectomy. We will recheck her iron indices today and if they continue to be significantly low I would suggest an iron infusion. I would also like her to do some stool cards at home and bring them back, she was somewhat resistant to doing upper endoscopy.

## 2022-02-10 NOTE — Assessment & Plan Note (Signed)
Continue current anxiety medication dosing, she can do gabapentin and we are increasing the dose, she tells me gabapentin does seem to help anxiety as well so I am happy for her to take an additional gabapentin through the day.

## 2022-02-11 ENCOUNTER — Other Ambulatory Visit (HOSPITAL_COMMUNITY): Payer: Self-pay

## 2022-02-11 LAB — CBC
HCT: 35.8 % (ref 35.0–45.0)
Hemoglobin: 11.7 g/dL (ref 11.7–15.5)
MCH: 26.8 pg — ABNORMAL LOW (ref 27.0–33.0)
MCHC: 32.7 g/dL (ref 32.0–36.0)
MCV: 81.9 fL (ref 80.0–100.0)
MPV: 10.1 fL (ref 7.5–12.5)
Platelets: 246 10*3/uL (ref 140–400)
RBC: 4.37 10*6/uL (ref 3.80–5.10)
RDW: 22 % — ABNORMAL HIGH (ref 11.0–15.0)
WBC: 6.4 10*3/uL (ref 3.8–10.8)

## 2022-02-11 LAB — IRON,TIBC AND FERRITIN PANEL
%SAT: 69 % (calc) — ABNORMAL HIGH (ref 16–45)
Ferritin: 31 ng/mL (ref 16–232)
Iron: 278 ug/dL — ABNORMAL HIGH (ref 40–190)
TIBC: 404 mcg/dL (calc) (ref 250–450)

## 2022-02-11 LAB — B12 AND FOLATE PANEL
Folate: >24 ng/mL
Vitamin B-12: 365 pg/mL (ref 200–1100)

## 2022-02-13 MED FILL — Rizatriptan Benzoate Oral Disintegrating Tab 10 MG (Base Eq): ORAL | 30 days supply | Qty: 10 | Fill #1 | Status: AC

## 2022-02-14 ENCOUNTER — Other Ambulatory Visit (HOSPITAL_COMMUNITY): Payer: Self-pay

## 2022-02-23 ENCOUNTER — Other Ambulatory Visit: Payer: Self-pay | Admitting: Sports Medicine

## 2022-02-23 ENCOUNTER — Other Ambulatory Visit (HOSPITAL_COMMUNITY): Payer: Self-pay

## 2022-02-23 DIAGNOSIS — M26602 Left temporomandibular joint disorder, unspecified: Secondary | ICD-10-CM

## 2022-02-23 MED ORDER — TRAMADOL HCL 50 MG PO TABS
100.0000 mg | ORAL_TABLET | Freq: Three times a day (TID) | ORAL | 0 refills | Status: DC | PRN
  Filled 2022-02-23: qty 180, 30d supply, fill #0

## 2022-02-28 ENCOUNTER — Other Ambulatory Visit (HOSPITAL_COMMUNITY): Payer: Self-pay

## 2022-02-28 MED FILL — Rizatriptan Benzoate Oral Disintegrating Tab 10 MG (Base Eq): ORAL | 30 days supply | Qty: 10 | Fill #2 | Status: CN

## 2022-03-01 ENCOUNTER — Other Ambulatory Visit (HOSPITAL_COMMUNITY): Payer: Self-pay

## 2022-03-01 MED FILL — Rizatriptan Benzoate Oral Disintegrating Tab 10 MG (Base Eq): ORAL | 30 days supply | Qty: 10 | Fill #2 | Status: CN

## 2022-03-02 ENCOUNTER — Other Ambulatory Visit (HOSPITAL_COMMUNITY): Payer: Self-pay

## 2022-03-09 ENCOUNTER — Encounter: Payer: Self-pay | Admitting: Sports Medicine

## 2022-03-10 ENCOUNTER — Ambulatory Visit: Payer: 59 | Admitting: Sports Medicine

## 2022-03-17 ENCOUNTER — Encounter: Payer: Self-pay | Admitting: Sports Medicine

## 2022-03-17 ENCOUNTER — Ambulatory Visit: Payer: 59 | Admitting: Sports Medicine

## 2022-03-18 ENCOUNTER — Ambulatory Visit (INDEPENDENT_AMBULATORY_CARE_PROVIDER_SITE_OTHER): Payer: 59 | Admitting: Sports Medicine

## 2022-03-18 ENCOUNTER — Other Ambulatory Visit (HOSPITAL_COMMUNITY): Payer: Self-pay

## 2022-03-18 ENCOUNTER — Encounter: Payer: Self-pay | Admitting: Sports Medicine

## 2022-03-18 DIAGNOSIS — N946 Dysmenorrhea, unspecified: Secondary | ICD-10-CM | POA: Diagnosis not present

## 2022-03-18 DIAGNOSIS — F419 Anxiety disorder, unspecified: Secondary | ICD-10-CM

## 2022-03-18 DIAGNOSIS — D500 Iron deficiency anemia secondary to blood loss (chronic): Secondary | ICD-10-CM

## 2022-03-18 DIAGNOSIS — G43709 Chronic migraine without aura, not intractable, without status migrainosus: Secondary | ICD-10-CM | POA: Diagnosis not present

## 2022-03-18 MED ORDER — TOPIRAMATE 100 MG PO TABS
100.0000 mg | ORAL_TABLET | Freq: Every day | ORAL | 11 refills | Status: DC
  Filled 2022-03-18: qty 30, 30d supply, fill #0
  Filled 2022-04-18: qty 30, 30d supply, fill #1
  Filled 2022-05-17: qty 30, 30d supply, fill #2
  Filled 2022-07-08: qty 30, 30d supply, fill #3

## 2022-03-18 MED ORDER — BUPROPION HCL ER (XL) 150 MG PO TB24
150.0000 mg | ORAL_TABLET | ORAL | 11 refills | Status: DC
  Filled 2022-03-18: qty 30, 30d supply, fill #0

## 2022-03-18 NOTE — Assessment & Plan Note (Signed)
Resolved with iron supplementation. Has not yet started birth control.

## 2022-03-18 NOTE — Progress Notes (Signed)
    Procedures performed today:    None.  Independent interpretation of notes and tests performed by another provider:   None.  Brief History, Exam, Impression, and Recommendations:    Anxiety and depression Low returns, she is a pleasant 46 year old female, known anxiety and depression, on maximum Lexapro and moderate dose BuSpar 3 times daily. No symptoms of mania or hypomania. Adding Wellbutrin 150. We will consider Vraylar if insufficient improvement at the follow-up. Contracted for safety. Also doing behavioral therapy.   Dysmenorrhea Has not started birth control yet.  Iron deficiency anemia Resolved with iron supplementation. Has not yet started birth control.  Migraine headache Still with multiple migraines and high use of Maxalt, increasing topiramate to 100 nightly. If insufficient improvement at the follow-up we will consider the newer medication such as Nurtec.   ____________________________________________ Gwen Her. Dianah Field, M.D., ABFM., CAQSM., AME. Primary Care and Sports Medicine [place] [facility]  Adjunct Professor of [place] [employer]  Risk manager

## 2022-03-18 NOTE — Assessment & Plan Note (Signed)
Has not started birth control yet.

## 2022-03-18 NOTE — Assessment & Plan Note (Signed)
Low returns, she is a pleasant 46 year old female, known anxiety and depression, on maximum Lexapro and moderate dose BuSpar 3 times daily. No symptoms of mania or hypomania. Adding Wellbutrin 150. We will consider Vraylar if insufficient improvement at the follow-up. Contracted for safety. Also doing behavioral therapy.

## 2022-03-18 NOTE — Assessment & Plan Note (Signed)
Still with multiple migraines and high use of Maxalt, increasing topiramate to 100 nightly. If insufficient improvement at the follow-up we will consider the newer medication such as Nurtec.

## 2022-03-21 ENCOUNTER — Other Ambulatory Visit (HOSPITAL_COMMUNITY): Payer: Self-pay

## 2022-03-21 ENCOUNTER — Other Ambulatory Visit: Payer: Self-pay | Admitting: Sports Medicine

## 2022-03-21 DIAGNOSIS — M26602 Left temporomandibular joint disorder, unspecified: Secondary | ICD-10-CM

## 2022-03-21 MED ORDER — TRAMADOL HCL 50 MG PO TABS
100.0000 mg | ORAL_TABLET | Freq: Three times a day (TID) | ORAL | 0 refills | Status: DC | PRN
  Filled 2022-03-21 – 2022-03-22 (×2): qty 180, 30d supply, fill #0
  Filled 2022-03-24 – 2022-03-25 (×3): qty 42, 7d supply, fill #0
  Filled 2022-04-04: qty 138, 23d supply, fill #1

## 2022-03-21 MED FILL — Rizatriptan Benzoate Oral Disintegrating Tab 10 MG (Base Eq): ORAL | 30 days supply | Qty: 10 | Fill #2 | Status: AC

## 2022-03-22 ENCOUNTER — Other Ambulatory Visit (HOSPITAL_COMMUNITY): Payer: Self-pay

## 2022-03-24 ENCOUNTER — Other Ambulatory Visit (HOSPITAL_COMMUNITY): Payer: Self-pay

## 2022-03-25 ENCOUNTER — Other Ambulatory Visit (HOSPITAL_COMMUNITY): Payer: Self-pay

## 2022-04-04 ENCOUNTER — Other Ambulatory Visit (HOSPITAL_COMMUNITY): Payer: Self-pay

## 2022-04-15 ENCOUNTER — Ambulatory Visit (INDEPENDENT_AMBULATORY_CARE_PROVIDER_SITE_OTHER): Payer: PRIVATE HEALTH INSURANCE | Admitting: Sports Medicine

## 2022-04-15 ENCOUNTER — Encounter: Payer: Self-pay | Admitting: Sports Medicine

## 2022-04-15 ENCOUNTER — Other Ambulatory Visit (HOSPITAL_COMMUNITY): Payer: Self-pay

## 2022-04-15 VITALS — BP 132/89 | HR 96 | Wt 123.0 lb

## 2022-04-15 DIAGNOSIS — F419 Anxiety disorder, unspecified: Secondary | ICD-10-CM

## 2022-04-15 DIAGNOSIS — Z23 Encounter for immunization: Secondary | ICD-10-CM | POA: Diagnosis not present

## 2022-04-15 MED ORDER — BUPROPION HCL ER (XL) 150 MG PO TB24
150.0000 mg | ORAL_TABLET | ORAL | 3 refills | Status: DC
  Filled 2022-04-15: qty 30, 30d supply, fill #0
  Filled 2022-05-17: qty 30, 30d supply, fill #1
  Filled 2022-06-21: qty 30, 30d supply, fill #2
  Filled 2022-07-20 – 2022-07-21 (×2): qty 30, 30d supply, fill #3

## 2022-04-15 MED ORDER — BUSPIRONE HCL 10 MG PO TABS
10.0000 mg | ORAL_TABLET | Freq: Three times a day (TID) | ORAL | 3 refills | Status: DC
  Filled 2022-04-15: qty 90, 30d supply, fill #0
  Filled 2022-07-08: qty 90, 30d supply, fill #1

## 2022-04-15 NOTE — Progress Notes (Signed)
    Procedures performed today:    None.  Independent interpretation of notes and tests performed by another provider:   None.  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    ____________________________________________ Gwen Her. Dianah Field, M.D., ABFM., CAQSM., AME. Primary Care and Sports Medicine  [facility]  Adjunct Professor of [place] [employer] [facility] [employer] Medicine  Risk manager

## 2022-04-15 NOTE — Assessment & Plan Note (Signed)
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

## 2022-04-18 ENCOUNTER — Other Ambulatory Visit (HOSPITAL_COMMUNITY): Payer: Self-pay

## 2022-04-18 MED FILL — Rizatriptan Benzoate Oral Disintegrating Tab 10 MG (Base Eq): ORAL | 30 days supply | Qty: 10 | Fill #3 | Status: AC

## 2022-04-25 ENCOUNTER — Other Ambulatory Visit: Payer: Self-pay | Admitting: Sports Medicine

## 2022-04-25 ENCOUNTER — Encounter: Payer: Self-pay | Admitting: Sports Medicine

## 2022-04-25 DIAGNOSIS — R052 Subacute cough: Secondary | ICD-10-CM

## 2022-04-25 DIAGNOSIS — M26602 Left temporomandibular joint disorder, unspecified: Secondary | ICD-10-CM

## 2022-04-25 DIAGNOSIS — R059 Cough, unspecified: Secondary | ICD-10-CM | POA: Insufficient documentation

## 2022-04-25 MED ORDER — HYDROCOD POLI-CHLORPHE POLI ER 10-8 MG/5ML PO SUER: 5.0000 mL | Freq: Two times a day (BID) | ORAL | 0 refills | Status: DC | PRN

## 2022-04-25 NOTE — Assessment & Plan Note (Signed)
Postviral type cough, adding Tussionex.

## 2022-04-26 ENCOUNTER — Other Ambulatory Visit (HOSPITAL_COMMUNITY): Payer: Self-pay

## 2022-04-26 ENCOUNTER — Encounter: Payer: Self-pay | Admitting: Sports Medicine

## 2022-04-26 DIAGNOSIS — M26602 Left temporomandibular joint disorder, unspecified: Secondary | ICD-10-CM

## 2022-04-26 MED ORDER — TRAMADOL HCL 50 MG PO TABS
100.0000 mg | ORAL_TABLET | Freq: Three times a day (TID) | ORAL | 0 refills | Status: DC | PRN
  Filled 2022-04-26: qty 42, 7d supply, fill #0
  Filled 2022-05-03: qty 138, 23d supply, fill #1

## 2022-05-03 ENCOUNTER — Other Ambulatory Visit (HOSPITAL_COMMUNITY): Payer: Self-pay

## 2022-05-17 ENCOUNTER — Other Ambulatory Visit (HOSPITAL_COMMUNITY): Payer: Self-pay

## 2022-05-17 MED FILL — Rizatriptan Benzoate Oral Disintegrating Tab 10 MG (Base Eq): ORAL | 30 days supply | Qty: 10 | Fill #4 | Status: CN

## 2022-05-17 MED FILL — Valacyclovir HCl Tab 1 GM: ORAL | 30 days supply | Qty: 60 | Fill #0 | Status: CN

## 2022-05-18 ENCOUNTER — Other Ambulatory Visit (HOSPITAL_COMMUNITY): Payer: Self-pay

## 2022-05-18 MED FILL — Rizatriptan Benzoate Oral Disintegrating Tab 10 MG (Base Eq): ORAL | 30 days supply | Qty: 10 | Fill #4 | Status: AC

## 2022-05-18 MED FILL — Valacyclovir HCl Tab 1 GM: ORAL | 30 days supply | Qty: 60 | Fill #0 | Status: AC

## 2022-05-26 ENCOUNTER — Other Ambulatory Visit: Payer: Self-pay | Admitting: Sports Medicine

## 2022-05-26 DIAGNOSIS — M26602 Left temporomandibular joint disorder, unspecified: Secondary | ICD-10-CM

## 2022-05-27 ENCOUNTER — Other Ambulatory Visit (HOSPITAL_COMMUNITY): Payer: Self-pay

## 2022-05-27 ENCOUNTER — Encounter: Payer: Self-pay | Admitting: Sports Medicine

## 2022-05-27 DIAGNOSIS — M26602 Left temporomandibular joint disorder, unspecified: Secondary | ICD-10-CM

## 2022-05-27 MED ORDER — TRAMADOL HCL 50 MG PO TABS
100.0000 mg | ORAL_TABLET | Freq: Three times a day (TID) | ORAL | 0 refills | Status: DC | PRN
  Filled 2022-05-27: qty 180, 30d supply, fill #0

## 2022-06-21 ENCOUNTER — Other Ambulatory Visit (HOSPITAL_COMMUNITY): Payer: Self-pay

## 2022-06-21 ENCOUNTER — Other Ambulatory Visit: Payer: Self-pay

## 2022-06-21 ENCOUNTER — Other Ambulatory Visit: Payer: Self-pay | Admitting: Sports Medicine

## 2022-06-21 DIAGNOSIS — F419 Anxiety disorder, unspecified: Secondary | ICD-10-CM

## 2022-06-21 MED ORDER — HYDROXYZINE PAMOATE 100 MG PO CAPS
100.0000 mg | ORAL_CAPSULE | Freq: Three times a day (TID) | ORAL | 3 refills | Status: DC | PRN
  Filled 2022-06-21: qty 90, 30d supply, fill #0
  Filled 2022-07-20 – 2022-07-21 (×2): qty 90, 30d supply, fill #1

## 2022-06-21 MED FILL — Rizatriptan Benzoate Oral Disintegrating Tab 10 MG (Base Eq): ORAL | 30 days supply | Qty: 10 | Fill #5 | Status: AC

## 2022-06-22 ENCOUNTER — Other Ambulatory Visit: Payer: Self-pay

## 2022-06-22 ENCOUNTER — Other Ambulatory Visit (HOSPITAL_COMMUNITY): Payer: Self-pay

## 2022-06-24 ENCOUNTER — Other Ambulatory Visit (HOSPITAL_COMMUNITY): Payer: Self-pay

## 2022-06-27 ENCOUNTER — Other Ambulatory Visit: Payer: Self-pay

## 2022-06-27 ENCOUNTER — Other Ambulatory Visit: Payer: Self-pay | Admitting: Sports Medicine

## 2022-06-27 ENCOUNTER — Encounter: Payer: Self-pay | Admitting: Sports Medicine

## 2022-06-27 DIAGNOSIS — M26602 Left temporomandibular joint disorder, unspecified: Secondary | ICD-10-CM

## 2022-06-27 MED ORDER — TRAMADOL HCL 50 MG PO TABS: 100.0000 mg | ORAL_TABLET | Freq: Three times a day (TID) | ORAL | 0 refills | Status: DC | PRN

## 2022-06-28 ENCOUNTER — Other Ambulatory Visit: Payer: Self-pay | Admitting: Sports Medicine

## 2022-06-28 ENCOUNTER — Other Ambulatory Visit (HOSPITAL_COMMUNITY): Payer: Self-pay

## 2022-06-28 ENCOUNTER — Encounter: Payer: Self-pay | Admitting: Sports Medicine

## 2022-06-28 DIAGNOSIS — M26602 Left temporomandibular joint disorder, unspecified: Secondary | ICD-10-CM

## 2022-06-28 MED ORDER — TRAMADOL HCL 50 MG PO TABS
100.0000 mg | ORAL_TABLET | Freq: Three times a day (TID) | ORAL | 0 refills | Status: DC | PRN
  Filled 2022-06-28: qty 42, 7d supply, fill #0
  Filled 2022-07-05: qty 138, 23d supply, fill #1

## 2022-07-05 ENCOUNTER — Other Ambulatory Visit (HOSPITAL_COMMUNITY): Payer: Self-pay

## 2022-07-08 ENCOUNTER — Other Ambulatory Visit (HOSPITAL_COMMUNITY): Payer: Self-pay

## 2022-07-20 ENCOUNTER — Other Ambulatory Visit: Payer: Self-pay

## 2022-07-20 MED FILL — Rizatriptan Benzoate Oral Disintegrating Tab 10 MG (Base Eq): ORAL | 30 days supply | Qty: 10 | Fill #6 | Status: CN

## 2022-07-21 ENCOUNTER — Other Ambulatory Visit (HOSPITAL_COMMUNITY): Payer: Self-pay

## 2022-07-21 MED FILL — Rizatriptan Benzoate Oral Disintegrating Tab 10 MG (Base Eq): ORAL | 30 days supply | Qty: 10 | Fill #6 | Status: AC

## 2022-07-28 ENCOUNTER — Other Ambulatory Visit (HOSPITAL_BASED_OUTPATIENT_CLINIC_OR_DEPARTMENT_OTHER): Payer: Self-pay

## 2022-07-28 ENCOUNTER — Other Ambulatory Visit (HOSPITAL_COMMUNITY): Payer: Self-pay

## 2022-07-28 ENCOUNTER — Encounter: Payer: Self-pay | Admitting: Sports Medicine

## 2022-07-28 DIAGNOSIS — M26602 Left temporomandibular joint disorder, unspecified: Secondary | ICD-10-CM

## 2022-07-28 MED ORDER — GABAPENTIN 300 MG PO CAPS: ORAL_CAPSULE | ORAL | 11 refills | Status: DC

## 2022-07-28 MED ORDER — TRAMADOL HCL 50 MG PO TABS: 100.0000 mg | ORAL_TABLET | Freq: Three times a day (TID) | ORAL | 1 refills | Status: DC | PRN

## 2022-08-02 ENCOUNTER — Encounter: Payer: Self-pay | Admitting: Sports Medicine

## 2022-08-02 DIAGNOSIS — K219 Gastro-esophageal reflux disease without esophagitis: Secondary | ICD-10-CM

## 2022-08-02 MED ORDER — PANTOPRAZOLE SODIUM 40 MG PO TBEC: 40.0000 mg | DELAYED_RELEASE_TABLET | Freq: Two times a day (BID) | ORAL | 3 refills | Status: DC

## 2022-08-23 ENCOUNTER — Encounter: Payer: Self-pay | Admitting: Sports Medicine

## 2022-09-27 ENCOUNTER — Encounter: Payer: Self-pay | Admitting: Sports Medicine

## 2022-09-27 ENCOUNTER — Other Ambulatory Visit: Payer: Self-pay | Admitting: Sports Medicine

## 2022-10-14 ENCOUNTER — Encounter: Payer: Self-pay | Admitting: Sports Medicine

## 2022-10-14 ENCOUNTER — Ambulatory Visit (INDEPENDENT_AMBULATORY_CARE_PROVIDER_SITE_OTHER): Payer: PRIVATE HEALTH INSURANCE | Admitting: Sports Medicine

## 2022-10-14 VITALS — BP 124/84 | HR 89

## 2022-10-14 DIAGNOSIS — K219 Gastro-esophageal reflux disease without esophagitis: Secondary | ICD-10-CM | POA: Diagnosis not present

## 2022-10-14 DIAGNOSIS — F419 Anxiety disorder, unspecified: Secondary | ICD-10-CM

## 2022-10-14 DIAGNOSIS — G43709 Chronic migraine without aura, not intractable, without status migrainosus: Secondary | ICD-10-CM | POA: Diagnosis not present

## 2022-10-14 MED ORDER — NURTEC 75 MG PO TBDP
ORAL_TABLET | ORAL | 11 refills | Status: DC
Start: 2022-10-14 — End: 2023-04-26

## 2022-10-14 MED ORDER — SUCRALFATE 1 G PO TABS
1.0000 g | ORAL_TABLET | Freq: Four times a day (QID) | ORAL | 0 refills | Status: DC
Start: 2022-10-14 — End: 2023-04-26

## 2022-10-14 NOTE — Assessment & Plan Note (Signed)
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 She has discontinued Lexapro and mood continues to be stable on BuSpar 3 times daily and Wellbutrin. She will start some counseling and she can return to see me as needed for this.

## 2022-10-14 NOTE — Assessment & Plan Note (Signed)
Was having some increasing low back pain and took some Aleve, unfortunately this created significant midepigastric discomfort consistent with gastritis. No signs or symptoms of GI bleed. We will add Carafate, she will continue her pantoprazole twice a day for now.

## 2022-10-14 NOTE — Progress Notes (Signed)
    Procedures performed today:    None.  Independent interpretation of notes and tests performed by another provider:   None.  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 More recently she has had worsening migraines not fully controlled with Maxalt, not prevented by Topamax. Continue above medications, adding Nurtec every other day for prevention, she can increase to daily for migraine abortive treatment. I like to see her back in a month with a migraine diary.  LPRD (laryngopharyngeal reflux disease) Was having some increasing low back pain and took some Aleve, unfortunately this created significant midepigastric discomfort consistent with gastritis. No signs or symptoms of GI bleed. We will add Carafate, she will continue her pantoprazole twice a day for now.   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 She has discontinued Lexapro and mood continues to be stable on BuSpar 3 times daily and Wellbutrin. She will start some counseling and she can return to see me as needed for this.    ____________________________________________ Ihor Austin. Benjamin Stain, M.D., ABFM., CAQSM., AME. Primary Care and Sports Medicine [place] [facility]  Adjunct Professor of Family Medicine  [place] [employer] [facility] [employer]  Restaurant manager, fast food

## 2022-10-14 NOTE — Assessment & Plan Note (Signed)
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 More recently she has had worsening migraines not fully controlled with Maxalt, not prevented by Topamax. Continue above medications, adding Nurtec every other day for prevention, she can increase to daily for migraine abortive treatment. I like to see her back in a month with a migraine diary.

## 2022-11-11 ENCOUNTER — Encounter: Payer: Self-pay | Admitting: Sports Medicine

## 2022-11-11 ENCOUNTER — Ambulatory Visit: Payer: PRIVATE HEALTH INSURANCE | Admitting: Sports Medicine

## 2022-11-11 VITALS — BP 116/78 | HR 87

## 2022-11-11 DIAGNOSIS — G43709 Chronic migraine without aura, not intractable, without status migrainosus: Secondary | ICD-10-CM

## 2022-11-11 DIAGNOSIS — K219 Gastro-esophageal reflux disease without esophagitis: Secondary | ICD-10-CM

## 2022-11-11 NOTE — Assessment & Plan Note (Signed)
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 We will continue this plan for now.

## 2022-11-11 NOTE — Progress Notes (Signed)
    Procedures performed today:    None.  Independent interpretation of notes and tests performed by another provider:   None.  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 We will continue this plan for now.  LPRD (laryngopharyngeal reflux disease) Much better with the addition of Carafate to her pantoprazole.    ____________________________________________ Ihor Austin. Benjamin Stain, M.D., ABFM., CAQSM., AME. Primary Care and Sports Medicine [place] [facility]  Adjunct Professor of Family Medicine  [place] [employer] [facility] [employer]  Restaurant manager, fast food

## 2022-11-11 NOTE — Assessment & Plan Note (Signed)
Much better with the addition of Carafate to her pantoprazole.

## 2022-11-22 ENCOUNTER — Encounter: Payer: Self-pay | Admitting: Sports Medicine

## 2022-11-22 DIAGNOSIS — G43709 Chronic migraine without aura, not intractable, without status migrainosus: Secondary | ICD-10-CM

## 2022-11-22 DIAGNOSIS — F419 Anxiety disorder, unspecified: Secondary | ICD-10-CM

## 2022-11-22 DIAGNOSIS — R519 Headache, unspecified: Secondary | ICD-10-CM

## 2022-11-22 MED ORDER — HYDROXYZINE PAMOATE 100 MG PO CAPS
100.0000 mg | ORAL_CAPSULE | Freq: Three times a day (TID) | ORAL | 3 refills | Status: DC | PRN
Start: 2022-11-22 — End: 2023-07-06

## 2022-11-22 MED ORDER — RIZATRIPTAN BENZOATE 10 MG PO TBDP
ORAL_TABLET | ORAL | 11 refills | Status: DC
Start: 2022-11-22 — End: 2024-03-29

## 2022-11-25 ENCOUNTER — Encounter: Payer: Self-pay | Admitting: Sports Medicine

## 2022-11-25 MED ORDER — TRAMADOL HCL 50 MG PO TABS: 100.0000 mg | ORAL_TABLET | Freq: Three times a day (TID) | ORAL | 1 refills | Status: DC | PRN

## 2022-11-25 NOTE — Telephone Encounter (Signed)
Requesting rx rf tramadol Last written 09/27/22  Last OV 11/11/2022 Next schlded appt 01/24/2023

## 2023-01-04 ENCOUNTER — Other Ambulatory Visit: Payer: Self-pay | Admitting: Sports Medicine

## 2023-01-04 DIAGNOSIS — K219 Gastro-esophageal reflux disease without esophagitis: Secondary | ICD-10-CM

## 2023-01-05 ENCOUNTER — Encounter: Payer: Self-pay | Admitting: Sports Medicine

## 2023-01-23 ENCOUNTER — Encounter: Payer: Self-pay | Admitting: Sports Medicine

## 2023-01-23 MED ORDER — TRAMADOL HCL 50 MG PO TABS: 100.0000 mg | ORAL_TABLET | Freq: Three times a day (TID) | ORAL | 1 refills | Status: DC | PRN

## 2023-01-24 ENCOUNTER — Encounter: Payer: Self-pay | Admitting: Sports Medicine

## 2023-02-07 ENCOUNTER — Encounter: Payer: Self-pay | Admitting: Sports Medicine

## 2023-02-13 ENCOUNTER — Encounter: Payer: Self-pay | Admitting: Sports Medicine

## 2023-02-13 ENCOUNTER — Ambulatory Visit (INDEPENDENT_AMBULATORY_CARE_PROVIDER_SITE_OTHER): Payer: Commercial Managed Care - PPO | Admitting: Sports Medicine

## 2023-02-13 VITALS — BP 118/87 | HR 82

## 2023-02-13 DIAGNOSIS — F419 Anxiety disorder, unspecified: Secondary | ICD-10-CM

## 2023-02-13 NOTE — Assessment & Plan Note (Signed)
Healthy changes continue, divorce has finalized, she is working at a new job where she will make significantly more money. She has a dog. Happy with how things are going so no changes in medication, return to see me in 6 months to year or sooner if needed. As she did change employers she is without insurance currently so I will no charge this visit.

## 2023-02-13 NOTE — Progress Notes (Signed)
    Procedures performed today:    None.  Independent interpretation of notes and tests performed by another provider:   None.  Brief History, Exam, Impression, and Recommendations:    Anxiety and depression Healthy changes continue, divorce has finalized, she is working at a new job where she will make significantly more money. She has a dog. Happy with how things are going so no changes in medication, return to see me in 6 months to year or sooner if needed. As she did change employers she is without insurance currently so I will no charge this visit.    ____________________________________________ Ihor Austin. Benjamin Stain, M.D., ABFM., CAQSM., AME. Primary Care and Sports Medicine [place] [facility]  Adjunct Professor of Family Medicine  [employer] [facility] [employer]  Restaurant manager, fast food

## 2023-02-21 ENCOUNTER — Encounter: Payer: Self-pay | Admitting: Sports Medicine

## 2023-02-21 DIAGNOSIS — F419 Anxiety disorder, unspecified: Secondary | ICD-10-CM

## 2023-02-21 DIAGNOSIS — M542 Cervicalgia: Secondary | ICD-10-CM

## 2023-02-21 MED ORDER — METHOCARBAMOL 500 MG PO TABS
500.0000 mg | ORAL_TABLET | Freq: Three times a day (TID) | ORAL | 3 refills | Status: DC
Start: 2023-02-21 — End: 2024-04-15

## 2023-02-21 MED ORDER — BUPROPION HCL ER (XL) 150 MG PO TB24: 150.0000 mg | ORAL_TABLET | ORAL | 3 refills | Status: DC

## 2023-02-21 MED ORDER — TRAMADOL HCL 50 MG PO TABS: 100.0000 mg | ORAL_TABLET | Freq: Three times a day (TID) | ORAL | 3 refills | Status: DC | PRN

## 2023-02-23 ENCOUNTER — Ambulatory Visit (INDEPENDENT_AMBULATORY_CARE_PROVIDER_SITE_OTHER): Payer: Self-pay

## 2023-02-23 DIAGNOSIS — Z23 Encounter for immunization: Secondary | ICD-10-CM

## 2023-04-19 ENCOUNTER — Ambulatory Visit (INDEPENDENT_AMBULATORY_CARE_PROVIDER_SITE_OTHER): Payer: BC Managed Care – PPO | Admitting: Sports Medicine

## 2023-04-19 ENCOUNTER — Ambulatory Visit: Payer: BC Managed Care – PPO

## 2023-04-19 ENCOUNTER — Encounter: Payer: Self-pay | Admitting: Sports Medicine

## 2023-04-19 DIAGNOSIS — M545 Low back pain, unspecified: Secondary | ICD-10-CM | POA: Diagnosis not present

## 2023-04-19 DIAGNOSIS — M533 Sacrococcygeal disorders, not elsewhere classified: Secondary | ICD-10-CM | POA: Diagnosis not present

## 2023-04-19 MED ORDER — PREDNISONE 50 MG PO TABS: ORAL_TABLET | ORAL | 0 refills | Status: DC

## 2023-04-19 NOTE — Assessment & Plan Note (Signed)
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 Suspect more of a sacral insufficiency type process. Adding lumbar and SI joint x-rays, 5 days of prednisone, home conditioning, she will chart standing up from now on, return to see me in about 6 weeks.

## 2023-04-19 NOTE — Progress Notes (Signed)
    Procedures performed today:    None.  Independent interpretation of notes and tests performed by another provider:   None.  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 Suspect more of a sacral insufficiency type process. Adding lumbar and SI joint x-rays, 5 days of prednisone, home conditioning, she will chart standing up from now on, return to see me in about 6 weeks.    ____________________________________________ Ihor Austin. Benjamin Stain, M.D., ABFM., CAQSM., AME. Primary Care and Sports Medicine [place] [facility]  Adjunct Professor of Family Medicine  [place] [employer] [facility] [employer]  Restaurant manager, fast food

## 2023-04-25 ENCOUNTER — Encounter: Payer: Self-pay | Admitting: Sports Medicine

## 2023-04-25 DIAGNOSIS — K219 Gastro-esophageal reflux disease without esophagitis: Secondary | ICD-10-CM

## 2023-04-25 DIAGNOSIS — G43709 Chronic migraine without aura, not intractable, without status migrainosus: Secondary | ICD-10-CM

## 2023-04-26 MED ORDER — NURTEC 75 MG PO TBDP: ORAL_TABLET | ORAL | 4 refills | Status: DC

## 2023-04-26 MED ORDER — TOPIRAMATE 100 MG PO TABS: 100.0000 mg | ORAL_TABLET | Freq: Every day | ORAL | 4 refills | Status: DC

## 2023-04-26 MED ORDER — SUCRALFATE 1 G PO TABS: 1.0000 g | ORAL_TABLET | Freq: Four times a day (QID) | ORAL | 0 refills | Status: DC

## 2023-05-23 ENCOUNTER — Other Ambulatory Visit: Payer: Self-pay | Admitting: Sports Medicine

## 2023-05-23 DIAGNOSIS — F419 Anxiety disorder, unspecified: Secondary | ICD-10-CM

## 2023-05-25 ENCOUNTER — Ambulatory Visit: Payer: BC Managed Care – PPO | Admitting: Sports Medicine

## 2023-05-25 ENCOUNTER — Encounter: Payer: Self-pay | Admitting: Sports Medicine

## 2023-05-25 DIAGNOSIS — F419 Anxiety disorder, unspecified: Secondary | ICD-10-CM | POA: Diagnosis not present

## 2023-05-25 MED ORDER — BUSPIRONE HCL 15 MG PO TABS: 15.0000 mg | ORAL_TABLET | Freq: Three times a day (TID) | ORAL | 11 refills | Status: DC

## 2023-05-25 MED ORDER — BUPROPION HCL ER (XL) 300 MG PO TB24: 300.0000 mg | ORAL_TABLET | ORAL | 11 refills | Status: DC

## 2023-05-25 NOTE — Progress Notes (Signed)
    Procedures performed today:    None.  Independent interpretation of notes and tests performed by another provider:   None.  Brief History, Exam, Impression, and Recommendations:    Anxiety and depression Very pleasant 47 year old female, historically has some anxiety and depression, things are going pretty good on Wellbutrin 150 and BuSpar 10 mg 3 times daily. Still having some baseline anxiety and depression, contracts for safety. We will increase Wellbutrin to 300 mg and BuSpar to 15 mg 3 times daily. Return to see me in 6 weeks.    ____________________________________________ Ihor Austin. Benjamin Stain, M.D., ABFM., CAQSM., AME. Primary Care and Sports Medicine [place] [facility]  Adjunct Professor of Family Medicine  [place] [employer] [facility] [employer]  Restaurant manager, fast food

## 2023-05-25 NOTE — Assessment & Plan Note (Addendum)
Very pleasant 47 year old female, historically has some anxiety and depression, things are going pretty good on Wellbutrin 150 and BuSpar 10 mg 3 times daily. Still having some baseline anxiety and depression, contracts for safety. We will increase Wellbutrin to 300 mg and BuSpar to 15 mg 3 times daily. Return to see me in 6 weeks.

## 2023-05-30 ENCOUNTER — Ambulatory Visit: Payer: Self-pay | Admitting: Sports Medicine

## 2023-05-30 NOTE — Telephone Encounter (Signed)
Definitely needs appointment [place] Melter, and [place] all have appointment slots tomorrow.

## 2023-05-30 NOTE — Telephone Encounter (Signed)
Chief Complaint: "flu like symptoms" Symptoms: diarrhea, nausea, chills, sweats, "mouth watering"  Frequency: x 3-4 days  Pertinent Negatives: Patient denies chest pain, SOB, abdominal pain, blood in stool, headaches, bodyaches  Disposition: [] ED /[] Urgent Care (no appt availability in office) / [] Appointment(In office/virtual)/ []  [place] [facility]/ [] Home Care/ [] Refused Recommended Disposition /[] [place] [facility]/ [x]  Follow-up with PCP  Additional Notes: Patient c/o diarrhea, chills, sweats, nausea, "mouth watering" since Saturday or Sunday. Pt states severe diarrhea yesterday and states it has improved to 1-2 episodes today. Pt states she is able to keep clear fluids down. Denies signs of dehydration. Pt states she thinks it is related to her Wellbutrin and Buspar doses being increased recently. Pt requesting a call back if she should go back to her previous doses of meds. Called CAL line and spoke with Marylene Land regarding how to proceed with disposition. Agreed to send note high priority for PCP to advise patient. Pt agreeable to call back triage line if she has not heard back from PCP and/or symptoms worsen.  Copied from CRM [id number]. Topic: Clinical - Pink Word Triage >> May 30, 2023  2:12 PM Clayton Bibles wrote: Reason for Triage: April Nielsen is really sick with flu like symptons; no fever. Doctor up her med Wellbutrin last Friday and she wants to know if this is causing her symptoms Reason for Disposition  [1] SEVERE diarrhea (e.g., 7 or more times / day more than normal) AND [2] present > 24 hours (1 day)  Answer Assessment - Initial Assessment Questions 1. DIARRHEA SEVERITY: "How bad is the diarrhea?" "How many more stools have you had in the past 24 hours than normal?"    - NO DIARRHEA (SCALE 0)   - MILD (SCALE 1-3): Few loose or mushy BMs; increase of 1-3 stools over normal daily number of stools; mild increase in ostomy output.   -  MODERATE (SCALE 4-7): Increase of 4-6 stools  daily over normal; moderate increase in ostomy output.   -  SEVERE (SCALE 8-10; OR "WORST POSSIBLE"): Increase of 7 or more stools daily over normal; moderate increase in ostomy output; incontinence.     Pt states today only about twice she has had BM's; reports yesterday 15-20 episodes of diarrhea.  2. ONSET: "When did the diarrhea begin?"      Pt reports it started Saturday or Sunday.  3. BM CONSISTENCY: "How loose or watery is the diarrhea?"      Watery  4. VOMITING: "Are you also vomiting?" If Yes, ask: "How many times in the past 24 hours?"      Denies vomiting, reports nausea and mouth watering.  5. ABDOMEN PAIN: "Are you having any abdomen pain?" If Yes, ask: "What does it feel like?" (e.g., crampy, dull, intermittent, constant)      Denies abdominal pain.  6. ABDOMEN PAIN SEVERITY: If present, ask: "How bad is the pain?"  (e.g., Scale 1-10; mild, moderate, or severe)   - MILD (1-3): doesn't interfere with normal activities, abdomen soft and not tender to touch    - MODERATE (4-7): interferes with normal activities or awakens from sleep, abdomen tender to touch    - SEVERE (8-10): excruciating pain, doubled over, unable to do any normal activities       Denies.  7. ORAL INTAKE: If vomiting, "Have you been able to drink liquids?" "How much liquids have you had in the past 24 hours?"     Pt reports she has been able to drink water, ginger  ale and soup.   8. HYDRATION: "Any signs of dehydration?" (e.g., dry mouth [not just dry lips], too weak to stand, dizziness, new weight loss) "When did you last urinate?"     Denies signs of dehydration; states urine is fairly clear yellow.  9. EXPOSURE: "Have you traveled to a foreign country recently?" "Have you been exposed to anyone with diarrhea?" "Could you have eaten any food that was spoiled?"     Pt denies anyone else in the home having symptoms and states she can't think of anything she ate that would cause the symptoms. 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  10. ANTIBIOTIC USE: "Are you taking antibiotics now or have you taken antibiotics in the past 2 months?"       Denies recent antibiotic use.  11. OTHER SYMPTOMS: "Do you have any other symptoms?" (e.g., fever, blood in stool)       Chills, unsure if she had a fever, sweating.  Protocols used: [facility]

## 2023-05-31 ENCOUNTER — Encounter: Payer: Self-pay | Admitting: Sports Medicine

## 2023-05-31 NOTE — Telephone Encounter (Signed)
Patient declined appointment.

## 2023-06-20 ENCOUNTER — Encounter: Payer: Self-pay | Admitting: Sports Medicine

## 2023-06-20 MED ORDER — TRAMADOL HCL 50 MG PO TABS: 100.0000 mg | ORAL_TABLET | Freq: Three times a day (TID) | ORAL | 3 refills | Status: DC | PRN

## 2023-06-22 ENCOUNTER — Telehealth: Payer: Self-pay

## 2023-06-22 NOTE — Telephone Encounter (Signed)
 Initiated Prior authorization YQM:VHQIONGE HCl 50MG  tablets Via: Covermymeds Case/Key:[id number] Status: Pending as of 06/21/22 Reason:Authorization Expiration Date: 12/19/2023 Notified Pt via: Mychart

## 2023-06-26 ENCOUNTER — Other Ambulatory Visit: Payer: Self-pay | Admitting: Sports Medicine

## 2023-06-26 DIAGNOSIS — F419 Anxiety disorder, unspecified: Secondary | ICD-10-CM

## 2023-07-06 ENCOUNTER — Telehealth: Payer: Self-pay

## 2023-07-06 ENCOUNTER — Ambulatory Visit: Payer: BC Managed Care – PPO | Admitting: Sports Medicine

## 2023-07-06 DIAGNOSIS — F419 Anxiety disorder, unspecified: Secondary | ICD-10-CM

## 2023-07-06 MED ORDER — HYDROXYZINE HCL 50 MG PO TABS: 50.0000 mg | ORAL_TABLET | Freq: Three times a day (TID) | ORAL | 3 refills | Status: DC | PRN

## 2023-07-06 NOTE — Telephone Encounter (Signed)
Okay lets just switch back to the 50 mg tablets and she will do 2 tabs up to 3 times daily.

## 2023-07-06 NOTE — Telephone Encounter (Signed)
The Hydroxyzine 100 mg is not covered under her plan. The lower strengths of the Hydroxyzine is covered.

## 2023-07-06 NOTE — Telephone Encounter (Signed)
Patient informed. 

## 2023-07-06 NOTE — Telephone Encounter (Signed)
Copied from CRM [id number]. Topic: General - Call Back - No Documentation >> Jul 06, 2023  9:17 AM Nila Nephew wrote: Reason for CRM: Patient returning call for Selena Batten, no documentation. Patient would also like to discuss about a PA for hydroxixine.

## 2023-07-20 ENCOUNTER — Ambulatory Visit: Payer: 59 | Admitting: Sports Medicine

## 2023-07-20 VITALS — BP 121/82 | HR 89 | Ht 64.0 in | Wt 130.0 lb

## 2023-07-20 DIAGNOSIS — G8929 Other chronic pain: Secondary | ICD-10-CM | POA: Diagnosis not present

## 2023-07-20 DIAGNOSIS — M25561 Pain in right knee: Secondary | ICD-10-CM

## 2023-07-20 DIAGNOSIS — F419 Anxiety disorder, unspecified: Secondary | ICD-10-CM

## 2023-07-20 NOTE — Progress Notes (Signed)
    Procedures performed today:    None.  Independent interpretation of notes and tests performed by another provider:   None.  Brief History, Exam, Impression, and Recommendations:    Chronic pain of right knee Increasing right knee pain, continue oral analgesics and NSAIDs for now.  Anxiety and depression Symptoms currently resolved with Wellbutrin 300 and BuSpar 15 mg 3 times daily. Return to see me in a couple of months to revisit this.    ____________________________________________ Ihor Austin. Benjamin Stain, M.D., ABFM., CAQSM., AME. Primary Care and Sports Medicine [place] [facility]  Adjunct Professor of Family Medicine  [place] [employer] [facility] [employer]  Restaurant manager, fast food

## 2023-07-20 NOTE — Assessment & Plan Note (Signed)
Increasing right knee pain, continue oral analgesics and NSAIDs for now.

## 2023-07-20 NOTE — Assessment & Plan Note (Signed)
Symptoms currently resolved with Wellbutrin 300 and BuSpar 15 mg 3 times daily. Return to see me in a couple of months to revisit this.

## 2023-07-26 ENCOUNTER — Other Ambulatory Visit: Payer: Self-pay | Admitting: Sports Medicine

## 2023-07-26 DIAGNOSIS — G43709 Chronic migraine without aura, not intractable, without status migrainosus: Secondary | ICD-10-CM

## 2023-08-04 ENCOUNTER — Other Ambulatory Visit: Payer: Self-pay | Admitting: Sports Medicine

## 2023-08-07 ENCOUNTER — Other Ambulatory Visit: Payer: Self-pay

## 2023-08-07 ENCOUNTER — Encounter: Payer: Self-pay | Admitting: Sports Medicine

## 2023-08-07 MED ORDER — GABAPENTIN 300 MG PO CAPS: ORAL_CAPSULE | ORAL | 3 refills | Status: DC

## 2023-08-10 ENCOUNTER — Other Ambulatory Visit: Payer: Self-pay | Admitting: Sports Medicine

## 2023-08-10 DIAGNOSIS — F419 Anxiety disorder, unspecified: Secondary | ICD-10-CM

## 2023-08-14 ENCOUNTER — Encounter: Payer: Self-pay | Admitting: Sports Medicine

## 2023-08-31 ENCOUNTER — Encounter: Payer: Self-pay | Admitting: Sports Medicine

## 2023-08-31 DIAGNOSIS — R519 Headache, unspecified: Secondary | ICD-10-CM

## 2023-08-31 MED ORDER — ONDANSETRON 8 MG PO TBDP: 8.0000 mg | ORAL_TABLET | Freq: Three times a day (TID) | ORAL | 3 refills | Status: DC | PRN

## 2023-09-14 ENCOUNTER — Ambulatory Visit (INDEPENDENT_AMBULATORY_CARE_PROVIDER_SITE_OTHER): Payer: 59 | Admitting: Sports Medicine

## 2023-09-14 ENCOUNTER — Encounter: Payer: Self-pay | Admitting: Sports Medicine

## 2023-09-14 VITALS — BP 82/53 | HR 81 | Resp 20 | Ht 64.0 in | Wt 130.0 lb

## 2023-09-14 DIAGNOSIS — G8929 Other chronic pain: Secondary | ICD-10-CM | POA: Diagnosis not present

## 2023-09-14 DIAGNOSIS — M25561 Pain in right knee: Secondary | ICD-10-CM | POA: Diagnosis not present

## 2023-09-14 MED ORDER — TRAMADOL HCL 50 MG PO TABS
100.0000 mg | ORAL_TABLET | Freq: Three times a day (TID) | ORAL | 3 refills | Status: DC | PRN
Start: 2023-09-14 — End: 2023-10-16

## 2023-09-14 NOTE — Assessment & Plan Note (Signed)
Resolved with conservative tx.

## 2023-09-14 NOTE — Progress Notes (Signed)
    Procedures performed today:    None.  Independent interpretation of notes and tests performed by another provider:   None.  Brief History, Exam, Impression, and Recommendations:    Chronic pain of right knee Resolved with conservative tx    ____________________________________________ Ihor Austin. Benjamin Stain, M.D., ABFM., CAQSM., AME. Primary Care and Sports Medicine [place] [facility]  Adjunct Professor of Family Medicine  [place] [employer] [facility] of Medicine  Restaurant manager, fast food

## 2023-10-16 ENCOUNTER — Encounter: Payer: Self-pay | Admitting: Sports Medicine

## 2023-10-16 ENCOUNTER — Ambulatory Visit: Admitting: Sports Medicine

## 2023-10-16 DIAGNOSIS — M25561 Pain in right knee: Secondary | ICD-10-CM | POA: Diagnosis not present

## 2023-10-16 DIAGNOSIS — G43709 Chronic migraine without aura, not intractable, without status migrainosus: Secondary | ICD-10-CM

## 2023-10-16 DIAGNOSIS — G8929 Other chronic pain: Secondary | ICD-10-CM | POA: Diagnosis not present

## 2023-10-16 DIAGNOSIS — M533 Sacrococcygeal disorders, not elsewhere classified: Secondary | ICD-10-CM

## 2023-10-16 MED ORDER — TRAMADOL HCL 50 MG PO TABS: ORAL_TABLET | ORAL | 0 refills | Status: DC

## 2023-10-16 NOTE — Assessment & Plan Note (Signed)
 Overall doing okay, adding some additional home conditioning.

## 2023-10-16 NOTE — Progress Notes (Signed)
    Procedures performed today:    None.  Independent interpretation of notes and tests performed by another provider:   None.  Brief History, Exam, Impression, and Recommendations:    Chronic pain of right knee Overall doing okay, adding some additional home conditioning.  Pain in sacrum, right Recurrence of pain right low back, right sacrum, spends a lot of time sitting to do charting. X-rays were unrevealing. We will add some restrictions for sitting in a work note and I would also recommend that she get a convertible workstation.    ____________________________________________ Joselyn Nicely. Sandy Crumb, M.D., ABFM., CAQSM., AME. Primary Care and Sports Medicine [place] [facility]  Adjunct Professor of [facility] Medicine  [employer]  Restaurant manager, fast food

## 2023-10-16 NOTE — Assessment & Plan Note (Signed)
 Recurrence of pain right low back, right sacrum, spends a lot of time sitting to do charting. X-rays were unrevealing. We will add some restrictions for sitting in a work note and I would also recommend that she get a convertible workstation.

## 2023-11-17 ENCOUNTER — Other Ambulatory Visit: Payer: Self-pay | Admitting: Sports Medicine

## 2023-11-17 DIAGNOSIS — G43709 Chronic migraine without aura, not intractable, without status migrainosus: Secondary | ICD-10-CM

## 2023-12-19 ENCOUNTER — Other Ambulatory Visit: Payer: Self-pay | Admitting: Sports Medicine

## 2024-01-17 ENCOUNTER — Encounter: Payer: Self-pay | Admitting: Sports Medicine

## 2024-01-18 MED ORDER — TRAMADOL HCL 50 MG PO TABS: ORAL_TABLET | ORAL | 0 refills | Status: DC

## 2024-01-19 ENCOUNTER — Other Ambulatory Visit: Payer: Self-pay | Admitting: Sports Medicine

## 2024-01-19 DIAGNOSIS — K219 Gastro-esophageal reflux disease without esophagitis: Secondary | ICD-10-CM

## 2024-01-19 NOTE — Telephone Encounter (Signed)
 I called patient she is now scheduled for a physical

## 2024-01-27 ENCOUNTER — Other Ambulatory Visit: Payer: Self-pay | Admitting: Sports Medicine

## 2024-01-27 DIAGNOSIS — G43709 Chronic migraine without aura, not intractable, without status migrainosus: Secondary | ICD-10-CM

## 2024-01-30 ENCOUNTER — Ambulatory Visit (INDEPENDENT_AMBULATORY_CARE_PROVIDER_SITE_OTHER): Admitting: Sports Medicine

## 2024-01-30 VITALS — BP 114/73 | HR 88 | Ht 64.0 in | Wt 120.0 lb

## 2024-01-30 DIAGNOSIS — R739 Hyperglycemia, unspecified: Secondary | ICD-10-CM | POA: Diagnosis not present

## 2024-01-30 DIAGNOSIS — G43709 Chronic migraine without aura, not intractable, without status migrainosus: Secondary | ICD-10-CM | POA: Diagnosis not present

## 2024-01-30 DIAGNOSIS — F419 Anxiety disorder, unspecified: Secondary | ICD-10-CM | POA: Diagnosis not present

## 2024-01-30 DIAGNOSIS — E782 Mixed hyperlipidemia: Secondary | ICD-10-CM

## 2024-01-30 DIAGNOSIS — E349 Endocrine disorder, unspecified: Secondary | ICD-10-CM

## 2024-01-30 DIAGNOSIS — Z0000 Encounter for general adult medical examination without abnormal findings: Secondary | ICD-10-CM

## 2024-01-30 MED ORDER — PROPRANOLOL HCL 20 MG PO TABS: ORAL_TABLET | ORAL | 3 refills | Status: DC

## 2024-01-30 MED ORDER — TOPIRAMATE 100 MG PO TABS: 100.0000 mg | ORAL_TABLET | Freq: Every day | ORAL | 3 refills | Status: AC

## 2024-01-30 MED ORDER — BUPROPION HCL ER (XL) 300 MG PO TB24: 300.0000 mg | ORAL_TABLET | ORAL | 3 refills | Status: AC

## 2024-01-30 NOTE — Progress Notes (Addendum)
 Subjective:    CC: Annual Physical Exam  HPI:  This patient is here for their annual physical  I reviewed the past medical history, family history, social history, surgical history, and allergies today and no changes were needed.  Please see the problem list section below in epic for further details.  Past Medical History: Past Medical History:  Diagnosis Date   Abnormal Pap smear of cervix 2010   Bronchitis    GERD (gastroesophageal reflux disease)    Herpes    Stomach ulcer    Past Surgical History: Past Surgical History:  Procedure Laterality Date   c-sections     x3   CESAREAN SECTION     x 3   CHOLECYSTECTOMY     TUBAL LIGATION     WISDOM TOOTH EXTRACTION     Social History: Social History   Socioeconomic History   Marital status: Single    Spouse name: Not on file   Number of children: Not on file   Years of education: Not on file   Highest education level: Not on file  Occupational History   Occupation: RN  Tobacco Use   Smoking status: Former    Current packs/day: 0.00    Average packs/day: 1 pack/day for 3.0 years (3.0 ttl pk-yrs)    Types: Cigarettes    Start date: 06/20/2006    Quit date: 06/20/2009    Years since quitting: 14.6   Smokeless tobacco: Never   Tobacco comments:    tobacco use- no   Vaping Use   Vaping status: Never Used  Substance and Sexual Activity   Alcohol use: No    Alcohol/week: 0.0 standard drinks of alcohol   Drug use: No   Sexual activity: Yes    Partners: Male    Birth control/protection: Surgical  Other Topics Concern   Not on file  Social History Narrative   Not on file   Social Drivers of Health   Financial Resource Strain: Not on file  Food Insecurity: No Food Insecurity (07/27/2020)   [facility]   Hunger Vital Sign    Within the past 12 months, you worried that your food would run out before you got the money to buy more.: Never true    Within the past 12 months, the food you bought just  didn't last and you didn't have money to get more.: Never true  Transportation Needs: Not on file  Physical Activity: Not on file  Stress: Not on file  Social Connections: Unknown (10/19/2021)   [facility]   Social Network    Social Network: Not on file   Family History: Family History  Problem Relation Age of Onset   Hypertension Father    Hyperlipidemia Father    Hyperlipidemia Mother    Cancer Maternal Grandmother        cervical   Cancer Maternal Grandfather        Throat   Diabetes Paternal Grandmother    Heart attack Paternal Grandmother    Cancer Paternal Grandfather        Leukemia   Allergies: Allergies  Allergen Reactions   Sulfamethoxazole-Trimethoprim Palpitations    Pt states that she reacted to bactrim with symptoms of palpitations and mouth blister.   Pt states that she reacted to bactrim with symptoms of palpitations and mouth blister.   Medications: See med rec.  Review of Systems: No headache, visual changes, nausea, vomiting, diarrhea, constipation, dizziness, abdominal pain, skin rash, fevers, chills, night  sweats, swollen lymph nodes, weight loss, chest pain, body aches, joint swelling, muscle aches, shortness of breath, mood changes, visual or auditory hallucinations.  Objective:    General: Well Developed, well nourished, and in no acute distress.  Neuro: Alert and oriented x3, extra-ocular muscles intact, sensation grossly intact. Cranial nerves II through XII are intact, motor, sensory, and coordinative functions are all intact. HEENT: Normocephalic, atraumatic, pupils equal round reactive to light, neck supple, no masses, no lymphadenopathy, thyroid  nonpalpable. Oropharynx, nasopharynx, external ear canals are unremarkable. Skin: Warm and dry, no rashes noted.  Cardiac: Regular rate and rhythm, no murmurs rubs or gallops.  Respiratory: Clear to auscultation bilaterally. Not using accessory muscles, speaking in full sentences.   Abdominal: Soft, nontender, nondistended, positive bowel sounds, no masses, no organomegaly.  Musculoskeletal: Shoulder, elbow, wrist, hip, knee, ankle stable, and with full range of motion.  Impression and Recommendations:    The patient was counselled, risk factors were discussed, anticipatory guidance given.  Annual physical exam Fasting annual physical as above, up-to-date on screenings, checking routine labs. She is wondering about perimenopause, cycles are getting irregular, she is on day 2 of her bleed. Changes in mood. We will going get [facility] and [facility] as well.  Anxiety and depression Symptoms historically well-controlled with Butrans 300 and BuSpar  15 mg 3 times daily. She does have a bit of a tremor when working her current job, adding low-dose propranolol . Follow-up in 4 weeks to reevaluate.  Mixed hyperlipidemia Lipids are very elevated, for cardiovascular risk reduction, needs moderate to high dose Crestor with 3 month fasting lipid recheck.   Update: Patient declines Crestor, she will try lifestyle changes for a few months and then we can recheck fasting lipids.   ____________________________________________ Debby PARAS. Curtis, M.D., ABFM., CAQSM., AME. Primary Care and Sports Medicine [place] [facility]  Adjunct Professor of [facility] Medicine  [employer]  Restaurant manager, fast food

## 2024-01-30 NOTE — Assessment & Plan Note (Signed)
 Fasting annual physical as above, up-to-date on screenings, checking routine labs. She is wondering about perimenopause, cycles are getting irregular, she is on day 2 of her bleed. Changes in mood. We will going get [facility] and LH as well.

## 2024-01-30 NOTE — Assessment & Plan Note (Signed)
 Symptoms historically well-controlled with Butrans 300 and BuSpar  15 mg 3 times daily. She does have a bit of a tremor when working her current job, adding low-dose propranolol . Follow-up in 4 weeks to reevaluate.

## 2024-01-31 ENCOUNTER — Ambulatory Visit: Payer: Self-pay | Admitting: Sports Medicine

## 2024-01-31 DIAGNOSIS — E782 Mixed hyperlipidemia: Secondary | ICD-10-CM | POA: Insufficient documentation

## 2024-01-31 LAB — COMPREHENSIVE METABOLIC PANEL WITH GFR
ALT: 5 IU/L (ref 0–32)
AST: 13 IU/L (ref 0–40)
Albumin: 4.7 g/dL (ref 3.9–4.9)
Alkaline Phosphatase: 44 IU/L (ref 44–121)
BUN/Creatinine Ratio: 15 (ref 9–23)
BUN: 11 mg/dL (ref 6–24)
Bilirubin Total: 0.3 mg/dL (ref 0.0–1.2)
CO2: 20 mmol/L (ref 20–29)
Calcium: 9.3 mg/dL (ref 8.7–10.2)
Chloride: 104 mmol/L (ref 96–106)
Creatinine, Ser: 0.72 mg/dL (ref 0.57–1.00)
Globulin, Total: 2.4 g/dL (ref 1.5–4.5)
Glucose: 73 mg/dL (ref 70–99)
Potassium: 3.7 mmol/L (ref 3.5–5.2)
Sodium: 141 mmol/L (ref 134–144)
Total Protein: 7.1 g/dL (ref 6.0–8.5)
eGFR: 104 mL/min/1.73 (ref >59)

## 2024-01-31 LAB — LIPID PANEL
Chol/HDL Ratio: 3.5 ratio (ref 0.0–4.4)
Cholesterol, Total: 265 mg/dL — ABNORMAL HIGH (ref 100–199)
HDL: 75 mg/dL (ref >39)
LDL Chol Calc (NIH): 178 mg/dL — ABNORMAL HIGH (ref 0–99)
Triglycerides: 75 mg/dL (ref 0–149)
VLDL Cholesterol Cal: 12 mg/dL (ref 5–40)

## 2024-01-31 LAB — HEMOGLOBIN A1C
Est. average glucose Bld gHb Est-mCnc: 97 mg/dL
Hgb A1c MFr Bld: 5 % (ref 4.8–5.6)

## 2024-01-31 LAB — CBC
Hematocrit: 36.6 % (ref 34.0–46.6)
Hemoglobin: 11.9 g/dL (ref 11.1–15.9)
MCH: 29 pg (ref 26.6–33.0)
MCHC: 32.5 g/dL (ref 31.5–35.7)
MCV: 89 fL (ref 79–97)
Platelets: 243 x10E3/uL (ref 150–450)
RBC: 4.11 x10E6/uL (ref 3.77–5.28)
RDW: 12.9 % (ref 11.7–15.4)
WBC: 6.2 x10E3/uL (ref 3.4–10.8)

## 2024-01-31 LAB — LUTEINIZING HORMONE: LH: 8.5 m[IU]/mL

## 2024-01-31 LAB — FOLLICLE STIMULATING HORMONE: FSH: 13.1 m[IU]/mL

## 2024-01-31 LAB — TSH: TSH: 2.47 u[IU]/mL (ref 0.450–4.500)

## 2024-01-31 LAB — PROGESTERONE: Progesterone: 0.1 ng/mL

## 2024-01-31 LAB — ESTROGENS, TOTAL: Estrogen: 167 pg/mL

## 2024-01-31 NOTE — Assessment & Plan Note (Addendum)
 Lipids are very elevated, for cardiovascular risk reduction, needs moderate to high dose Crestor with 3 month fasting lipid recheck.   Update: Patient declines Crestor, she will try lifestyle changes for a few months and then we can recheck fasting lipids.

## 2024-02-09 ENCOUNTER — Encounter: Payer: Self-pay | Admitting: Sports Medicine

## 2024-02-13 ENCOUNTER — Telehealth: Payer: Self-pay | Admitting: Family Medicine

## 2024-02-13 NOTE — Telephone Encounter (Signed)
 Pt would like to establish care with you since Dr T is no longer here. I did inform patient that you were currently not accepting patients.

## 2024-02-20 ENCOUNTER — Telehealth: Payer: Self-pay | Admitting: Family Medicine

## 2024-02-20 ENCOUNTER — Encounter: Payer: Self-pay | Admitting: Sports Medicine

## 2024-02-20 NOTE — Telephone Encounter (Signed)
 Copied from CRM [id number]. Topic: Appointments - Transfer of Care >> Feb 16, 2024  1:32 PM Carrielelia G wrote: Pt is requesting to transfer FROM: Dr Curtis, Debby Pt is requesting to transfer TO: Dr Alvia, Velma Reason for requested transfer: previous provider left practice   Per chart msg, Dr Alvia perks this transfer to him.

## 2024-02-27 ENCOUNTER — Ambulatory Visit: Admitting: Sports Medicine

## 2024-03-01 ENCOUNTER — Other Ambulatory Visit: Payer: Self-pay

## 2024-03-01 MED ORDER — GABAPENTIN 300 MG PO CAPS: ORAL_CAPSULE | ORAL | 0 refills | Status: DC

## 2024-03-01 NOTE — Telephone Encounter (Signed)
 OK to send in a 90 day supply of Gabapentin  for this patient?

## 2024-03-20 ENCOUNTER — Ambulatory Visit: Admitting: Family Medicine

## 2024-03-25 ENCOUNTER — Ambulatory Visit: Admitting: Family Medicine

## 2024-03-29 ENCOUNTER — Encounter: Payer: Self-pay | Admitting: Family Medicine

## 2024-03-29 DIAGNOSIS — G43709 Chronic migraine without aura, not intractable, without status migrainosus: Secondary | ICD-10-CM

## 2024-03-29 DIAGNOSIS — R519 Headache, unspecified: Secondary | ICD-10-CM

## 2024-03-29 MED ORDER — RIZATRIPTAN BENZOATE 10 MG PO TBDP
ORAL_TABLET | ORAL | 2 refills | Status: AC
Start: 2024-03-29 — End: ?

## 2024-04-02 ENCOUNTER — Ambulatory Visit: Payer: Self-pay | Admitting: Family Medicine

## 2024-04-02 VITALS — BP 137/81 | HR 70 | Ht 64.0 in | Wt 123.0 lb

## 2024-04-02 DIAGNOSIS — F419 Anxiety disorder, unspecified: Secondary | ICD-10-CM

## 2024-04-02 DIAGNOSIS — M2550 Pain in unspecified joint: Secondary | ICD-10-CM

## 2024-04-02 DIAGNOSIS — G43709 Chronic migraine without aura, not intractable, without status migrainosus: Secondary | ICD-10-CM

## 2024-04-07 ENCOUNTER — Encounter: Payer: Self-pay | Admitting: Family Medicine

## 2024-04-07 NOTE — Assessment & Plan Note (Signed)
 Continue topiramate  with Maxalt  and/or Nurtec as needed.

## 2024-04-07 NOTE — Assessment & Plan Note (Signed)
 She remains on gabapentin  with Robaxin  as needed.  Active prescription for tramadol  to use as needed for pain as well.  She may need to establish with a new sports medicine or pain management provider.

## 2024-04-07 NOTE — Assessment & Plan Note (Signed)
 Overall stable at this time with combination of bupropion  and BuSpar .  Will plan to continue.  She will let me know if  worsening.

## 2024-04-07 NOTE — Progress Notes (Signed)
 April Nielsen - 48 y.o. female MRN [id number]  Date of birth: 11-19-1975  Subjective Chief Complaint  Patient presents with   Follow-up    Anxiety and depression    HPI April Nielsen is a 48 year old female here today for follow-up.  She is a previous patient of Dr. Curtis.  She does have history of depression and anxiety that is currently managed with bupropion  and BuSpar .  She does have some breakthrough anxiety but overall feels like this is working pretty well for her.  She has been on Lexapro  at 1 point in the past as well and did feel like this was helpful but does not think that she needs to add this back on at this time.  Migraines are stable with topiramate  daily and Maxalt  and/or Nurtec as needed.  She does have chronic joint pain is being managed by Dr. Curtis.  Currently managed with combination of gabapentin , Robaxin  as needed and tramadol .  She reports that this combination has been effective.    ROS:  A comprehensive ROS was completed and negative except as noted per HPI  Allergies  Allergen Reactions   Sulfamethoxazole-Trimethoprim Palpitations    Pt states that she reacted to bactrim with symptoms of palpitations and mouth blister.   Pt states that she reacted to bactrim with symptoms of palpitations and mouth blister.    Past Medical History:  Diagnosis Date   Abnormal Pap smear of cervix 2010   Bronchitis    GERD (gastroesophageal reflux disease)    Herpes    Stomach ulcer     Past Surgical History:  Procedure Laterality Date   c-sections     x3   CESAREAN SECTION     x 3   CHOLECYSTECTOMY     TUBAL LIGATION     WISDOM TOOTH EXTRACTION      Social History   Socioeconomic History   Marital status: Single    Spouse name: Not on file   Number of children: Not on file   Years of education: Not on file   Highest education level: Not on file  Occupational History   Occupation: RN  Tobacco Use   Smoking status: Former     Current packs/day: 0.00    Average packs/day: 1 pack/day for 3.0 years (3.0 ttl pk-yrs)    Types: Cigarettes    Start date: 06/20/2006    Quit date: 06/20/2009    Years since quitting: 14.8   Smokeless tobacco: Never   Tobacco comments:    tobacco use- no   Vaping Use   Vaping status: Never Used  Substance and Sexual Activity   Alcohol use: No    Alcohol/week: 0.0 standard drinks of alcohol   Drug use: No   Sexual activity: Yes    Partners: Male    Birth control/protection: Surgical  Other Topics Concern   Not on file  Social History Narrative   Not on file   Social Drivers of Health   Financial Resource Strain: Not on file  Food Insecurity: No Food Insecurity (07/27/2020)   [facility]   Hunger Vital Sign    Within the past 12 months, you worried that your food would run out before you got the money to buy more.: Never true    Within the past 12 months, the food you bought just didn't last and you didn't have money to get more.: Never true  Transportation Needs: Not on file  Physical Activity: Not on file  Stress:  Not on file  Social Connections: Unknown (10/19/2021)   Received from [facility]   Social Network    Social Network: Not on file    Family History  Problem Relation Age of Onset   Hypertension Father    Hyperlipidemia Father    Hyperlipidemia Mother    Cancer Maternal Grandmother        cervical   Cancer Maternal Grandfather        Throat   Diabetes Paternal Grandmother    Heart attack Paternal Grandmother    Cancer Paternal Grandfather        Leukemia    Health Maintenance  Topic Date Due   Hepatitis B Vaccines 19-59 Average Risk (1 of 3 - 19+ 3-dose series) Never done   Mammogram  Never done   Colonoscopy  Never done   COVID-19 Vaccine (4 - 2025-26 season) 04/18/2024 (Originally 02/19/2024)   Influenza Vaccine  09/17/2024 (Originally 01/19/2024)   Cervical Cancer Screening (HPV/Pap Cotest)  08/20/2025   DTaP/Tdap/Td (3 - Td or Tdap)  12/24/2031   Hepatitis C Screening  Completed   HIV Screening  Completed   Pneumococcal Vaccine  Aged Out   HPV VACCINES  Aged Out   Meningococcal B Vaccine  Aged Out     ----------------------------------------------------------------------------------------------------------------------------------------------------------------------------------------------------------------- Physical Exam BP 137/81 (BP Location: Left Arm, Patient Position: Sitting, Cuff Size: Normal)   Pulse 70   Ht 5' 4 (1.626 m)   Wt 123 lb (55.8 kg)   SpO2 100%   BMI 21.11 kg/m   Physical Exam Constitutional:      Appearance: Normal appearance.  Eyes:     General: No scleral icterus. Cardiovascular:     Rate and Rhythm: Normal rate and regular rhythm.  Pulmonary:     Effort: Pulmonary effort is normal.     Breath sounds: Normal breath sounds.  Neurological:     Mental Status: She is alert.  Psychiatric:        Mood and Affect: Mood normal.        Behavior: Behavior normal.     ------------------------------------------------------------------------------------------------------------------------------------------------------------------------------------------------------------------- Assessment and Plan  Anxiety and depression Overall stable at this time with combination of bupropion  and BuSpar .  Will plan to continue.  She will let me know if  worsening.  Migraine headache Continue topiramate  with Maxalt  and/or Nurtec as needed.  Polyarthralgia She remains on gabapentin  with Robaxin  as needed.  Active prescription for tramadol  to use as needed for pain as well.  She may need to establish with a new sports medicine or pain management provider.   No orders of the defined types were placed in this encounter.   Return in about 4 months (around 08/03/2024) for Mood/BH.

## 2024-04-15 ENCOUNTER — Other Ambulatory Visit: Payer: Self-pay

## 2024-04-15 DIAGNOSIS — M542 Cervicalgia: Secondary | ICD-10-CM

## 2024-04-15 MED ORDER — METHOCARBAMOL 500 MG PO TABS: 500.0000 mg | ORAL_TABLET | Freq: Three times a day (TID) | ORAL | 0 refills | Status: DC

## 2024-04-16 ENCOUNTER — Ambulatory Visit: Admitting: Sports Medicine

## 2024-04-16 ENCOUNTER — Ambulatory Visit: Admitting: Family Medicine

## 2024-04-30 ENCOUNTER — Other Ambulatory Visit: Payer: Self-pay

## 2024-04-30 DIAGNOSIS — F419 Anxiety disorder, unspecified: Secondary | ICD-10-CM

## 2024-04-30 MED ORDER — PROPRANOLOL HCL 20 MG PO TABS: ORAL_TABLET | ORAL | 3 refills | Status: DC

## 2024-05-13 ENCOUNTER — Encounter: Payer: Self-pay | Admitting: Family Medicine

## 2024-05-15 ENCOUNTER — Ambulatory Visit: Payer: Self-pay

## 2024-05-15 MED ORDER — TRAMADOL HCL 50 MG PO TABS: ORAL_TABLET | ORAL | 0 refills | Status: DC

## 2024-05-15 NOTE — Telephone Encounter (Signed)
 FYI Only or Action Required?: Action required by provider: medication refill request.  Patient was last seen in primary care on 04/02/2024 by Alvia Bring, DO.  Called Nurse Triage reporting Medication Refill.  Symptoms began several years ago.  Interventions attempted: Nothing.  Symptoms are: unchanged.  Triage Disposition: No disposition on file.  Patient/caregiver understands and will follow disposition?:   Copied from CRM [id number]. Topic: Clinical - Red Word Triage >> May 15, 2024  9:50 AM Wess RAMAN wrote: Red Word that prompted transfer to Nurse Triage: Neck pain and needs traMADol  (ULTRAM ) 50 MG tablet  Pharmacy:  [facility] [phone] - [place], [place] - [address] [place] [address] Phone: [phone] Fax: [phone] Hours: Not open 24 hours Answer Assessment - Initial Assessment Questions Patient needs new prescription.   LOV 04/03/24 with Dr. Alvia Patient reports Dr. Alvia stated will refill medications; Tramadol  was previously filled by Dr. ONEIDA.  Patient reports okay for appt today, if needed for pain medication.  1. DRUG NAME: What medicine do you need to have refilled?     tramadol  2. REFILLS REMAINING: How many refills are remaining? Notes: The label on the medicine or pill bottle will show how many refills are remaining. If there are no refills remaining, then a renewal may be needed.     0 3. PRESCRIBER: Who prescribed it? Note: The prescribing doctor or group is responsible for refill approvals.SABRA Curtis Debby JINNY, MD   5. PHARMACY: Have you contacted your pharmacy (drugstore)? Note: Some pharmacies will contact the doctor (or NP/PA).       6. SYMPTOMS: Do you have any symptoms? Neck pain; chronic conditions; hx herniated disc  Protocols used: Medication Refill and Renewal Call-A-AH

## 2024-05-15 NOTE — Telephone Encounter (Signed)
 Called [facility], pt requesting med, been calling since Monday, pt reports.  MD reports no appt needed, will send now.

## 2024-05-26 ENCOUNTER — Encounter: Payer: Self-pay | Admitting: Family Medicine

## 2024-05-27 NOTE — Telephone Encounter (Signed)
 The pharmacy has the prescription and will refill today. Patient advised.

## 2024-06-15 ENCOUNTER — Other Ambulatory Visit: Payer: Self-pay | Admitting: Family Medicine

## 2024-06-15 DIAGNOSIS — M542 Cervicalgia: Secondary | ICD-10-CM

## 2024-06-19 ENCOUNTER — Other Ambulatory Visit: Payer: Self-pay

## 2024-06-19 MED ORDER — GABAPENTIN 300 MG PO CAPS: ORAL_CAPSULE | ORAL | 0 refills | Status: DC

## 2024-07-15 ENCOUNTER — Ambulatory Visit: Payer: Self-pay

## 2024-07-15 NOTE — Telephone Encounter (Signed)
 FYI Only or Action Required?: FYI only for provider: appointment scheduled on 01.28.26.  Patient was last seen in primary care on 04/02/2024 by April Bring, DO.  Called Nurse Triage reporting Amenorrhea.  Symptoms began several days ago.  Interventions attempted: Other: herbal.  Symptoms are: gradually worsening.  Triage Disposition: See PCP Within 2 Weeks  Patient/caregiver understands and will follow disposition?: Yes   Message from April Nielsen sent at 07/15/2024  8:22 AM EST  Reason for Triage: The patient is currently experiencing symptoms consistent with the menopausal transition, including sweating, diarrhea vomiting, and difficulty sleeping. She reports that she has not had a menstrual period for the past two months. Patient is requesting a callback at [phone]   Reason for Disposition  [1] Menopause symptoms (e.g., hot flashes, irregular periods, vaginal dryness) AND [2] interfere with normal activities like work or sleep) AND [3] no improvement after using Care Advice  Answer Assessment - Initial Assessment Questions 1. MAIN SYMPTOM: What is your main symptom? How bad is it?  (e.g., difficulty concentrating, hot flashes, fatigue, irregular periods, vaginal dryness)     Sweating  2. ONSET: When did the sweating begin? (e.g., hours, days or weeks ago)     X 5-6 days go  3. MENOPAUSE: When was your last menstrual period?     2 months  4. MEDICINES: Are you taking any hormone prescription or over-the-counter medicines? (e.g., estrogen; estrogen/progesterone , herbal supplements, medicines for mood, new medicines, Niacin)     Denies  5. OTHER SYMPTOMS: Do you have any other symptoms? (e.g., insomnia, mood swings, palpitations)     Diarrhea (loose, 2-3 times per day), vomiting, nausea, and difficulty sleeping  6. CALLER'S COPING: How are you doing? (e.g., anxiety, depression, emotional state with menopause symptoms)      Pt reports ability to  cope  7. TREATMENTS AND RESPONSE: What have you done so far to try to make this better?     OTC herb   Pt reports menopausal symptoms: diarrhea, vomiting, sweating Pt is taking OTC herb Pt scheduled for a visit on  01.28.26 for further evaluation. Pt agrees with plan of care, will call back for any worsening symptoms  Protocols used: Menopause Symptoms and Questions-A-AH

## 2024-07-17 ENCOUNTER — Ambulatory Visit: Payer: Self-pay | Admitting: Family Medicine

## 2024-07-17 ENCOUNTER — Encounter: Payer: Self-pay | Admitting: Family Medicine

## 2024-07-17 VITALS — BP 124/80 | HR 80 | Ht 64.0 in | Wt 119.0 lb

## 2024-07-17 DIAGNOSIS — K219 Gastro-esophageal reflux disease without esophagitis: Secondary | ICD-10-CM

## 2024-07-17 DIAGNOSIS — R61 Generalized hyperhidrosis: Secondary | ICD-10-CM | POA: Diagnosis not present

## 2024-07-17 DIAGNOSIS — M542 Cervicalgia: Secondary | ICD-10-CM | POA: Diagnosis not present

## 2024-07-17 DIAGNOSIS — G43709 Chronic migraine without aura, not intractable, without status migrainosus: Secondary | ICD-10-CM | POA: Diagnosis not present

## 2024-07-17 MED ORDER — TRAMADOL HCL 50 MG PO TABS: 100.0000 mg | ORAL_TABLET | Freq: Three times a day (TID) | ORAL | 1 refills | Status: AC | PRN

## 2024-07-17 MED ORDER — SUCRALFATE 1 G PO TABS: 1.0000 g | ORAL_TABLET | Freq: Four times a day (QID) | ORAL | 0 refills | Status: AC

## 2024-07-17 MED ORDER — NURTEC 75 MG PO TBDP: ORAL_TABLET | ORAL | 4 refills | Status: AC

## 2024-07-17 NOTE — Assessment & Plan Note (Signed)
 Possibly menopause related.  Checking FSH/LH and TSH.

## 2024-07-17 NOTE — Progress Notes (Signed)
 " April Nielsen - 49 y.o. female MRN [id number]  Date of birth: 05-Sep-1975  Subjective Chief Complaint  Patient presents with   Night Sweats   Nausea   Diarrhea   Depression    HPI April Nielsen is a 49 y.o. female here today for follow up visit.   Reports stopping gabapentin  and buspar  when she had flu recently.  Didn't feel like she needed this.  Stopped propranolol  as well as her anxiety seemed better.  Since stopping these she has experience night sweats, nausea, diarrhea.  Menstrual cycles have changed and become less frequent.  She remains on wellbutrin  and fees that anxiety has remained stable off of these.   She is also requesting renewal of tramadol .  Has been on this for several years for management of her neck pain.  This works well for her.   ROS:  A comprehensive ROS was completed and negative except as noted per HPI  Allergies[1]  Past Medical History:  Diagnosis Date   Abnormal Pap smear of cervix 2010   Bronchitis    GERD (gastroesophageal reflux disease)    Herpes    Stomach ulcer     Past Surgical History:  Procedure Laterality Date   c-sections     x3   CESAREAN SECTION     x 3   CHOLECYSTECTOMY     TUBAL LIGATION     WISDOM TOOTH EXTRACTION      Social History   Socioeconomic History   Marital status: Single    Spouse name: Not on file   Number of children: Not on file   Years of education: Not on file   Highest education level: Not on file  Occupational History   Occupation: RN  Tobacco Use   Smoking status: Former    Current packs/day: 0.00    Average packs/day: 1 pack/day for 3.0 years (3.0 ttl pk-yrs)    Types: Cigarettes    Start date: 06/20/2006    Quit date: 06/20/2009    Years since quitting: 15.0   Smokeless tobacco: Never   Tobacco comments:    tobacco use- no   Vaping Use   Vaping status: Never Used  Substance and Sexual Activity   Alcohol use: No    Alcohol/week: 0.0 standard drinks of alcohol   Drug use: No   Sexual  activity: Yes    Partners: Male    Birth control/protection: Surgical  Other Topics Concern   Not on file  Social History Narrative   Not on file   Social Drivers of Health   Tobacco Use: Low Risk (05/13/2024)   Received from [facility]   Patient History    Smoking Tobacco Use: Never    Smokeless Tobacco Use: Never    Passive Exposure: Not on file  Recent Concern: Tobacco Use - Medium Risk (04/07/2024)   Patient History    Smoking Tobacco Use: Former    Smokeless Tobacco Use: Never    Passive Exposure: Not on Actuary Strain: Not on file  Food Insecurity: Not on file  Transportation Needs: Not on file  Physical Activity: Not on file  Stress: Not on file  Social Connections: Not on file  Depression (PHQ2-9): Low Risk (07/17/2024)   Depression (PHQ2-9)    PHQ-2 Score: 2  Alcohol Screen: Not on file  Housing: Not on file  Utilities: Not on file  Health Literacy: Not on file    Family History  Problem Relation Age of Onset  Hypertension Father    Hyperlipidemia Father    Hyperlipidemia Mother    Cancer Maternal Grandmother        cervical   Cancer Maternal Grandfather        Throat   Diabetes Paternal Grandmother    Heart attack Paternal Grandmother    Cancer Paternal Grandfather        Leukemia    Health Maintenance  Topic Date Due   Hepatitis B Vaccines 19-59 Average Risk (1 of 3 - 19+ 3-dose series) Never done   Mammogram  Never done   Colonoscopy  Never done   Influenza Vaccine  09/17/2024 (Originally 01/19/2024)   COVID-19 Vaccine (4 - 2025-26 season) 02/17/2025 (Originally 02/19/2024)   Cervical Cancer Screening (HPV/Pap Cotest)  08/20/2025   DTaP/Tdap/Td (3 - Td or Tdap) 12/24/2031   HPV VACCINES (No Doses Required) Completed   Hepatitis C Screening  Completed   HIV Screening  Completed   Pneumococcal Vaccine  Aged Out   Meningococcal B Vaccine  Aged Out      ----------------------------------------------------------------------------------------------------------------------------------------------------------------------------------------------------------------- Physical Exam BP 124/80 (BP Location: Left Arm, Patient Position: Sitting, Cuff Size: Small)   Pulse 80   Ht 5' 4 (1.626 m)   Wt 119 lb (54 kg)   SpO2 99%   BMI 20.43 kg/m   Physical Exam Constitutional:      Appearance: Normal appearance.  Eyes:     General: No scleral icterus. Cardiovascular:     Rate and Rhythm: Normal rate and regular rhythm.  Pulmonary:     Effort: Pulmonary effort is normal.     Breath sounds: Normal breath sounds.  Musculoskeletal:     Cervical back: Neck supple.  Neurological:     Mental Status: She is alert.  Psychiatric:        Mood and Affect: Mood normal.        Behavior: Behavior normal.     ------------------------------------------------------------------------------------------------------------------------------------------------------------------------------------------------------------------- Assessment and Plan  Migraine headache Continue topiramate  with Maxalt  and/or Nurtec as needed.  LPRD (laryngopharyngeal reflux disease) Continue carafate  prn.   Right C6 radiculopathy Remains on tramadol  as needed for chronic neck and back pain.  PDMP reviewed.  Tramadol  renewed.   Night sweats Possibly menopause related.  Checking FSH/LH and TSH.    Meds ordered this encounter  Medications   traMADol  (ULTRAM ) 50 MG tablet    Sig: Take 2 tablets (100 mg total) by mouth every 8 (eight) hours as needed.    Dispense:  540 tablet    Refill:  1    Please retain refills.   Rimegepant Sulfate (NURTEC) 75 MG TBDP    Sig: 1 TAB EVERY OTHER DAY FOR MIGRAINE PREVENTION, MAY INCREASE TO DAILY FOR MIGRAINE ABORTION    Dispense:  30 tablet    Refill:  4   sucralfate  (CARAFATE ) 1 g tablet    Sig: Take 1 tablet (1 g total) by mouth 4  (four) times daily.    Dispense:  120 tablet    Refill:  0    Return in about 6 months (around 01/14/2025) for Chronic pain, Mood.        [1]  Allergies Allergen Reactions   Sulfamethoxazole-Trimethoprim Palpitations    Pt states that she reacted to bactrim with symptoms of palpitations and mouth blister.   Pt states that she reacted to bactrim with symptoms of palpitations and mouth blister.   "

## 2024-07-17 NOTE — Assessment & Plan Note (Signed)
 Remains on tramadol  as needed for chronic neck and back pain.  PDMP reviewed.  Tramadol  renewed.

## 2024-07-17 NOTE — Assessment & Plan Note (Signed)
 Continue carafate  prn.

## 2024-07-17 NOTE — Assessment & Plan Note (Signed)
 Continue topiramate  with Maxalt  and/or Nurtec as needed.

## 2024-07-18 LAB — CBC WITH DIFFERENTIAL/PLATELET
Basophils Absolute: 0.1 10*3/uL (ref 0.0–0.2)
Basos: 1 %
EOS (ABSOLUTE): 0.2 10*3/uL (ref 0.0–0.4)
Eos: 2 %
Hematocrit: 33.6 % — ABNORMAL LOW (ref 34.0–46.6)
Hemoglobin: 10.5 g/dL — ABNORMAL LOW (ref 11.1–15.9)
Immature Grans (Abs): 0 10*3/uL (ref 0.0–0.1)
Immature Granulocytes: 0 %
Lymphocytes Absolute: 1.7 10*3/uL (ref 0.7–3.1)
Lymphs: 27 %
MCH: 26.1 pg — ABNORMAL LOW (ref 26.6–33.0)
MCHC: 31.3 g/dL — ABNORMAL LOW (ref 31.5–35.7)
MCV: 84 fL (ref 79–97)
Monocytes Absolute: 0.6 10*3/uL (ref 0.1–0.9)
Monocytes: 9 %
Neutrophils Absolute: 3.8 10*3/uL (ref 1.4–7.0)
Neutrophils: 61 %
Platelets: 316 10*3/uL (ref 150–450)
RBC: 4.02 x10E6/uL (ref 3.77–5.28)
RDW: 14.5 % (ref 11.7–15.4)
WBC: 6.4 10*3/uL (ref 3.4–10.8)

## 2024-07-18 LAB — FSH/LH
FSH: 7.5 m[IU]/mL
LH: 6.6 m[IU]/mL

## 2024-07-18 LAB — TSH+FREE T4
Free T4: 1.03 ng/dL (ref 0.82–1.77)
TSH: 1.76 u[IU]/mL (ref 0.450–4.500)

## 2024-07-19 ENCOUNTER — Other Ambulatory Visit (HOSPITAL_COMMUNITY): Payer: Self-pay

## 2024-08-05 ENCOUNTER — Ambulatory Visit: Payer: Self-pay | Admitting: Family Medicine

## 2025-01-14 ENCOUNTER — Ambulatory Visit: Admitting: Family Medicine
